# Patient Record
Sex: Male | Born: 1940 | Race: White | Hispanic: No | Marital: Married | Smoking: Never smoker
Health system: Southern US, Community
[De-identification: ages and names within clinical notes are randomized; demographics above are authoritative.]

## PROBLEM LIST (undated history)

## (undated) DIAGNOSIS — Z96659 Presence of unspecified artificial knee joint: Secondary | ICD-10-CM

## (undated) DIAGNOSIS — M4800 Spinal stenosis, site unspecified: Secondary | ICD-10-CM

## (undated) DIAGNOSIS — E041 Nontoxic single thyroid nodule: Secondary | ICD-10-CM

## (undated) DIAGNOSIS — I6529 Occlusion and stenosis of unspecified carotid artery: Secondary | ICD-10-CM

## (undated) DIAGNOSIS — C801 Malignant (primary) neoplasm, unspecified: Secondary | ICD-10-CM

## (undated) DIAGNOSIS — M1990 Unspecified osteoarthritis, unspecified site: Secondary | ICD-10-CM

## (undated) DIAGNOSIS — I10 Essential (primary) hypertension: Secondary | ICD-10-CM

## (undated) HISTORY — DX: Spinal stenosis, site unspecified: M48.00

## (undated) HISTORY — DX: Occlusion and stenosis of unspecified carotid artery: I65.29

## (undated) HISTORY — PX: EYE SURGERY: SHX253

## (undated) HISTORY — PX: JOINT REPLACEMENT: SHX530

## (undated) HISTORY — PX: CHEST WALL BIOPSY: SHX1338

## (undated) HISTORY — DX: Essential (primary) hypertension: I10

## (undated) HISTORY — DX: Unspecified osteoarthritis, unspecified site: M19.90

## (undated) HISTORY — PX: LUMBAR FUSION: SHX111

## (undated) HISTORY — DX: Presence of unspecified artificial knee joint: Z96.659

---

## 2004-11-19 ENCOUNTER — Other Ambulatory Visit: Admission: RE | Admit: 2004-11-19 | Discharge: 2004-11-19 | Payer: Self-pay | Admitting: Dermatology

## 2006-03-27 ENCOUNTER — Encounter: Admission: RE | Admit: 2006-03-27 | Discharge: 2006-03-27 | Payer: Self-pay | Admitting: Neurosurgery

## 2008-10-04 ENCOUNTER — Ambulatory Visit: Payer: Self-pay | Admitting: Orthopedic Surgery

## 2008-10-04 DIAGNOSIS — M7580 Other shoulder lesions, unspecified shoulder: Secondary | ICD-10-CM

## 2008-10-04 DIAGNOSIS — M25519 Pain in unspecified shoulder: Secondary | ICD-10-CM

## 2008-11-28 ENCOUNTER — Ambulatory Visit: Payer: Self-pay | Admitting: Orthopedic Surgery

## 2010-04-08 ENCOUNTER — Ambulatory Visit (INDEPENDENT_AMBULATORY_CARE_PROVIDER_SITE_OTHER): Payer: Medicare Other | Admitting: Internal Medicine

## 2010-04-08 DIAGNOSIS — Z7982 Long term (current) use of aspirin: Secondary | ICD-10-CM

## 2010-04-08 DIAGNOSIS — K921 Melena: Secondary | ICD-10-CM

## 2010-04-21 ENCOUNTER — Ambulatory Visit (HOSPITAL_COMMUNITY)
Admission: RE | Admit: 2010-04-21 | Discharge: 2010-04-21 | Disposition: A | Discharge status: Home / Self Care | Payer: Medicare Other | Source: Ambulatory Visit | Attending: Internal Medicine | Admitting: Internal Medicine

## 2010-04-21 ENCOUNTER — Encounter (HOSPITAL_BASED_OUTPATIENT_CLINIC_OR_DEPARTMENT_OTHER): Payer: Medicare Other | Admitting: Internal Medicine

## 2010-04-21 ENCOUNTER — Other Ambulatory Visit (INDEPENDENT_AMBULATORY_CARE_PROVIDER_SITE_OTHER): Payer: Self-pay | Admitting: Internal Medicine

## 2010-04-21 DIAGNOSIS — R195 Other fecal abnormalities: Secondary | ICD-10-CM

## 2010-04-21 DIAGNOSIS — K644 Residual hemorrhoidal skin tags: Secondary | ICD-10-CM | POA: Insufficient documentation

## 2010-04-21 DIAGNOSIS — D126 Benign neoplasm of colon, unspecified: Secondary | ICD-10-CM

## 2010-04-21 DIAGNOSIS — Z7982 Long term (current) use of aspirin: Secondary | ICD-10-CM | POA: Insufficient documentation

## 2010-04-21 DIAGNOSIS — Z79899 Other long term (current) drug therapy: Secondary | ICD-10-CM | POA: Insufficient documentation

## 2010-04-21 DIAGNOSIS — I10 Essential (primary) hypertension: Secondary | ICD-10-CM | POA: Insufficient documentation

## 2010-04-21 DIAGNOSIS — K921 Melena: Secondary | ICD-10-CM | POA: Insufficient documentation

## 2010-05-13 NOTE — Consult Note (Signed)
NAME:  Randy Pena, Randy Pena               ACCOUNT NO.:  [id number]  MEDICAL RECORD NO.:  [id number]           PATIENT TYPE:  LOCATION:                                 FACILITY:  PHYSICIAN:  Lionel December, M.D.    DATE OF BIRTH:  07-16-1940  DATE OF CONSULTATION: DATE OF DISCHARGE:                                CONSULTATION   REASON FOR CONSULTATION:  Positive fecal occult blood.  HISTORY OF PRESENT ILLNESS:  Randy Pena is a 70 year old white male referred to our office by Dr. Margo Common for a positive fecal occult blood test that was performed in February 11, 2010.  He was seen at Dr. Jackolyn Confer office for routine physical and noted to have a   positive Hemoccult.  His hemoglobin February 21, 2010, is 15 and hematocrit was 44.2.  His MCV was 97.8.  Randy Pena denies any rectal bleeding.  He has one stool a day and they are brown in color.  He denies melena.  He does occasionally see a small amount of blood when he wipes.  His appetite has remained good.  He has had no weight loss.  No abdominal pain.  No nausea, vomiting, or weight loss.  Today, he does complain of some epigastric burning which has been occurring for about 3 weeks off and on.  He states he is waking up at night, and he will drink or eat something and it will calm it down.  There are no known allergies.  Home medications include lisinopril 20 mg a day, aspirin 81 mg a day, vitamin D 1000 units a day, MVI one a day, Aleve as needed.  PAST SURGICAL HISTORY:  He had an I and D to a perirectal abscess years ago, and he had a skin cancer removed 15 years ago.  PAST MEDICAL HISTORY:  Medical history includes hypertension.  He has back pain.  He also had a colonoscopy in 2006 by Dr. Cleotis Nipper which was normal.  There were no polyps.  FAMILY HISTORY:  His mother deceased from CHF.  His father deceased from malignant lymphoma, 2 sisters deceased, one from diabetes and coronary artery disease, one died from a blood clot after  undergoing a capsule surgery, two brothers one is alive with a history of CABG in good health.  One is deceased from an MI at age 65.  SOCIAL HISTORY:  He is married.  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  He does not smoke, drink or do drugs and he has 2 children in good health.  OBJECTIVE:  His weight is 218, height 5 feet 10 inches, temperature 97.2, blood pressure 112/82, his pulse is 76.  He has natural teeth. His oral mucosa is moist.  There are no lesions.  His conjunctivae are pink.  His sclerae anicteric.  His thyroid is normal.  There is no cervical lymphadenopathy.  His lungs are clear.  His abdomen is soft. Bowel sounds are positive.  He has slight epigastric tenderness, and there is no edema to his lower extremities.  ASSESSMENT:  Randy Pena is a 70 year old male presenting today with a positive fecal occult blood  test by Dr. Margo Common.  His last colonoscopy was in 2006 which was normal.  The colonic neoplasm needs to be ruled out as well as his ulcer or arteriovenous malformation.   RECOMMENDATIONS:  We will schedule a colonoscopy with Dr. Karilyn Cota in the near future.  The risk and benefits were reviewed with the patient and he is agreeable.  I am going to also start him on omeprazole 20 mg one a day for his epigastric tenderness to see if he can get relief.    ______________________________ Dorene Ar, NP   ______________________________ Lionel December, M.D.    TS/MEDQ  D:  04/08/2010  T:  04/08/2010  Job:  [id number]  cc:   Dr. Margo Common  Electronically Signed by Dorene Ar PA on 04/21/2010 09:41:53 AM Electronically Signed by Lionel December M.D. on 05/13/2010 09:39:27 AM

## 2010-05-13 NOTE — Op Note (Signed)
  [name], [name]               ACCOUNT NO.:  [id number]  MEDICAL RECORD NO.:  [id number]           PATIENT TYPE:  O  LOCATION:  [facility]                          FACILITY:  [facility]  PHYSICIAN:  Lionel December, M.D.    DATE OF BIRTH:  [birth year]  DATE OF PROCEDURE:  04/21/2010 DATE OF DISCHARGE:                              OPERATIVE REPORT   PROCEDURE:  Colonoscopy.  INDICATION:  Randy Pena is 70 year old Caucasian male who was found to have heme-positive stool on routine exam.  He denies history of rectal bleeding, or change in his bowel habits.  His last colonoscopy was over 5 years ago.  Family history is negative for CRC.  He is on low-dose Asa and also uses OTC NSAIDs on p.r.n. basis.  Procedures were reviewed with the patient.  Informed consent was obtained.  MEDS FOR CONSCIOUS SEDATION: 1. Demerol 50 mg IV. 2. Versed 5 mg IV.  FINDINGS:  Procedure performed in endoscopy suite.  The patient's vital signs and O2 sat were monitored during the procedure and remained stable.  The patient was placed in left lateral recumbent position. Rectal examination performed.  No abnormality noted on external or digital exam.  Pentax videoscope was placed through rectum and advanced under vision into sigmoid colon and beyond.  Preparation was excellent. Scope was passed into cecum which was identified by appendiceal orifice and ileocecal valve.  Pictures were taken for the record.  As the scope was withdrawn, colonic mucosa was carefully examined.  There was 12-13 mm submucosal lesion at distal transverse colon with yellowish discoloration.  Endoscopic appearance, typical of lipoma.  Pictures taken for the record.  Mucosa and rest of the colon was normal until in the distal sigmoid where there was 3-mm polyp which was ablated via cold biopsy.  Rectal mucosa was normal.  Scope was retroflexed to examine anorectal junction and small hemorrhoids noted below the dentate line. Endoscope was withdrawn.   Withdrawal time was 12 minutes.  The patient tolerated the procedure well.  FINAL DIAGNOSES: 1. Examination performed to cecum. 2. A 3-mm polyp ablated via cold biopsy from distal sigmoid colon. 3. 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 4. External hemorrhoids.  It is possible that patient's heme-positive stool is secondary to intermittent NSAID therapy.  Since he does not have any GI symptoms and his CBC is normal, we would not pursue with further studies.  RECOMMENDATIONS:  Standard instructions given.  I will be contacting patient with results of biopsy and further recommendations.     Lionel December, M.D.     NR/MEDQ  D:  04/21/2010  T:  04/21/2010  Job:  [id number]  cc:   Wyvonnia Lora Fax: [phone]  Electronically Signed by Lionel December M.D. on 05/13/2010 09:39:47 AM

## 2010-09-16 ENCOUNTER — Ambulatory Visit (INDEPENDENT_AMBULATORY_CARE_PROVIDER_SITE_OTHER): Payer: Medicare Other | Admitting: Orthopedic Surgery

## 2010-09-16 ENCOUNTER — Encounter: Payer: Self-pay | Admitting: Orthopedic Surgery

## 2010-09-16 ENCOUNTER — Other Ambulatory Visit: Payer: Self-pay

## 2010-09-16 VITALS — Resp 16 | Ht 70.0 in | Wt 224.0 lb

## 2010-09-16 DIAGNOSIS — M1710 Unilateral primary osteoarthritis, unspecified knee: Secondary | ICD-10-CM

## 2010-09-16 DIAGNOSIS — M1712 Unilateral primary osteoarthritis, left knee: Secondary | ICD-10-CM

## 2010-09-16 NOTE — Progress Notes (Signed)
Chief complaint: RIGHT knee pain HPI:(4) This is a 70 year old male has had pain in his RIGHT knee for 10 years located over the medial side associated with aching and moderately severe at times which is intermittent and worse with activity and better with rest  ROS:(2) Denies chest pain or neurologic symptoms  PFSH: (1)  Past Medical History  Diagnosis Date  . Spinal stenosis   . DJD (degenerative joint disease)   . HTN (hypertension)     Physical Exam(12) GENERAL: normal development   CDV: pulses are normal   Skin: normal  Lymph: nodes were not palpable/normal  Psychiatric: awake, alert and oriented  Neuro: normal sensation  MSK RIGHT knee.  He ambulates without any assistive device or lap.  He has a hint of varus alignment to his knee.  He has medial joint line tenderness.  He has maintained his range of motion and strength is normal.  All ligaments are stable.  Has a negative McMurray sign.  His LEFT knee has normal strength stability and alignment and range of motion is full.  Assessment: Osteoarthritis RIGHT knee    Plan: Non-operative treatment, weight restriction activity modification exercise Tylenol vs. Anti-inflammatory for pain    X-ray report separately identifiable 3 views RIGHT knee  Patellofemoral joint shows osteoarthritic changes with spurs there is moderate to severe joint space narrowing medially varus deformity which is mild  Impression osteoarthritis of the RIGHT knee primarily medial and patellofemoral compartments  Patient is advised to keep his weight down, take Tylenol and over-the-counter anti-inflammatories for pain use ice as needed follow up if symptoms worsen.

## 2010-09-16 NOTE — Patient Instructions (Signed)
Take Tylenol arthritis daily as directed  Ice the knee when you have swelling  Call us if you need fluid drawn out or if pain gets worse

## 2010-09-17 DIAGNOSIS — M1712 Unilateral primary osteoarthritis, left knee: Secondary | ICD-10-CM | POA: Insufficient documentation

## 2011-07-10 ENCOUNTER — Telehealth: Payer: Self-pay | Admitting: Orthopedic Surgery

## 2011-07-10 NOTE — Telephone Encounter (Signed)
Per patient's request 06/17/11 for copies of Xray films, film copies received from [facility]; called patient, left message with wife, who is aware of request.  States patient is working out of town at this time; will have him pick up as soon as possible.

## 2011-07-28 ENCOUNTER — Ambulatory Visit (INDEPENDENT_AMBULATORY_CARE_PROVIDER_SITE_OTHER): Payer: Medicare Other | Admitting: Orthopedic Surgery

## 2011-07-28 ENCOUNTER — Encounter: Payer: Self-pay | Admitting: Orthopedic Surgery

## 2011-07-28 VITALS — BP 120/72 | Ht 70.0 in | Wt 222.0 lb

## 2011-07-28 DIAGNOSIS — M1712 Unilateral primary osteoarthritis, left knee: Secondary | ICD-10-CM

## 2011-07-28 DIAGNOSIS — M1710 Unilateral primary osteoarthritis, unspecified knee: Secondary | ICD-10-CM

## 2011-07-28 NOTE — Telephone Encounter (Signed)
Patient picked up film(CD) today at his scheduled appointment.

## 2011-07-28 NOTE — Progress Notes (Signed)
Subjective:     Patient ID: Randy Pena, male   DOB: 04-Jul-1940, 70 y.o.   MRN: [id number]   Chief Complaint  Patient presents with  . Follow-up    Right knee pain.    HPI H/O OA RIGHT KNEE  HE IS HAVING INCREASING PAIN AND SWELLING OVER THE MEDIAL JOINT LINE    Review of Systems RECENT MELANOMA EXCISION    Objective:   Physical Exam Physical Exam(6) GENERAL: normal development   CDV: pulses are normal   Skin: normal  Psychiatric: awake, alert and oriented  Neuro: normal sensation  MSK GAIT NORMAL  1 JOINT EFFUSION- MILD  2 ROM =120 3 MEDIAL JOINT LINE TENDERNESS  4 ALL LIGAMENTS WERE STABLE   Assessment: OA PROGRESSING   X-RAYS MEDIAL JOINT LINE TENDERNESS  Plan: INJECT  ALEVE BID X 2 WEEKS  RETURN IN 3 WEEKS      Assessment:     SEE ABOVE    Plan:     SEE ABOVE

## 2011-07-28 NOTE — Patient Instructions (Addendum)
You have received a steroid shot. 15% of patients experience increased pain at the injection site with in the next 24 hours. This is best treated with ice and tylenol extra strength 2 tabs every 8 hours. If you are still having pain please call the office.   Take 1 aleve twice a day for next 2 weeks

## 2011-08-19 ENCOUNTER — Ambulatory Visit (INDEPENDENT_AMBULATORY_CARE_PROVIDER_SITE_OTHER): Payer: Medicare Other | Admitting: Orthopedic Surgery

## 2011-08-19 ENCOUNTER — Encounter: Payer: Self-pay | Admitting: Orthopedic Surgery

## 2011-08-19 VITALS — BP 110/70 | Ht 70.0 in | Wt 222.0 lb

## 2011-08-19 DIAGNOSIS — M1710 Unilateral primary osteoarthritis, unspecified knee: Secondary | ICD-10-CM

## 2011-08-19 DIAGNOSIS — M1712 Unilateral primary osteoarthritis, left knee: Secondary | ICD-10-CM

## 2011-08-19 NOTE — Progress Notes (Signed)
Subjective:     Patient ID: Randy Pena, male   DOB: 06/03/1940, 70 y.o.   MRN: [id number] Chief Complaint  Patient presents with  . Follow-up    3 week recheck on right knee.    BP 110/70  Ht 5\' 10"  (1.778 m)  Wt 222 lb (100.699 kg)  BMI 31.85 kg/m2   HPI Status post injection of the right knee. Doing well. Some stiffness at the end of the day. Able to do all activities. Minimal pain.  Review of Systems No catching or locking    Objective:   Physical Exam Physical Exam(6) GENERAL: normal development   CDV: pulses are normal   Skin: normal  Psychiatric: awake, alert and oriented  Neuro: normal sensation  MSK ambulation is without assistive device and no limp 1 no effusion no tenderness 2 negative McMurray's 3 normal strength 4 ligament stable muscle tone normal neurovascular exam as stated intact      Assessment:     Arthritis of the knee acute flare resolved    Plan:     Activities as tolerated follow as needed

## 2011-08-19 NOTE — Patient Instructions (Signed)
activities as tolerated 

## 2012-01-20 ENCOUNTER — Ambulatory Visit (INDEPENDENT_AMBULATORY_CARE_PROVIDER_SITE_OTHER): Payer: Medicare Other

## 2012-01-20 ENCOUNTER — Ambulatory Visit (INDEPENDENT_AMBULATORY_CARE_PROVIDER_SITE_OTHER): Payer: Medicare Other | Admitting: Orthopedic Surgery

## 2012-01-20 VITALS — BP 130/70 | Ht 70.0 in | Wt 222.0 lb

## 2012-01-20 DIAGNOSIS — M25569 Pain in unspecified knee: Secondary | ICD-10-CM

## 2012-01-20 MED ORDER — TRAMADOL-ACETAMINOPHEN 37.5-325 MG PO TABS: 1.0000 | ORAL_TABLET | ORAL | Status: DC | PRN

## 2012-01-20 NOTE — Patient Instructions (Addendum)
Total Knee Replacement Total knee replacement is a procedure to replace your knee joint with an artificial knee joint (prosthetic knee joint). The purpose of this surgery is to reduce pain and improve your knee function. LET YOUR CAREGIVER KNOW ABOUT:    Any allergies you have.   Any medicines you are taking, including vitamins, herbs, eyedrops, over-the-counter medicines, and creams.   Any problems you have had with the use of anesthetics.   Family history of problems with the use of anesthetics.   Any blood disorders you have, including bleeding problems or clotting problems.   Previous surgeries you have had.  RISKS AND COMPLICATIONS   Generally, total knee replacement is a safe procedure. However, as with any surgical procedure, complications can occur. Possible complications associated with total knee replacement include:  Loss of range of motion of the knee or instability.   Loosening of the prosthesis.   Infection.   Persistent pain.  BEFORE THE PROCEDURE    Your caregiver will instruct you when you need to stop eating and drinking.   Ask your caregiver if you need to change or stop any regular medicines.  PROCEDURE   Just before the procedure you will receive medicine that will make you drowsy (sedative). This will be given through a tube that is inserted into one of your veins (intravenous [IV] tube). Then you will either receive medicine to block pain from the waist down through your legs (spinal block) or medicine to also receive medicine to make you fall asleep (general anesthetic). You may also receive medicine to block feeling in your leg (nerve block) to help ease pain after surgery. An incision will be made in your knee. Your surgeon will take out any damaged cartilage and bone by sawing off the damaged surfaces. Then the surgeon will put a new metal liner over the sawed off portion of your thigh bone (femur) and a plastic liner over the sawed off portion of one of the  bones of your lower leg (tibia). This is to restore alignment and function to your knee. A plastic piece is often used to restore the surface of your knee cap. AFTER THE PROCEDURE   You will be taken to the recovery area. You may have drainage tubes to drain excess fluid from your knee. These tubes attach to a device that removes these fluids. Once you are awake, stable, and taking fluids well, you will be taken to your hospital room. You will receive physical therapy as prescribed by your caregiver. The length of your stay in the hospital after a knee replacement is 2 4 days. Your surgeon may recommend that you spend time (usually an additional 10 14 days) in an extended-care facility to help you begin walking again and improve your range of motion before you go home. You may also be prescribed blood-thinning medicine to decrease your risk of developing blood clots in your leg. Document Released: 05/04/2000 Document Revised: 07/28/2011 Document Reviewed: 03/08/2011 [facility] Patient Information 2013 [place], [place].   Total Knee Replacement Care After Refer to this sheet in the next few weeks. These instructions provide you with information on caring for yourself after your procedure. Your caregiver also may give you specific instructions. Your treatment has been planned according to the most current medical practices, but problems sometimes occur. Call your caregiver if you have any problems or questions after your procedure. HOME CARE INSTRUCTIONS    See a physical therapist as directed by your caregiver.   Take over-the-counter or  prescription medicines for pain, discomfort, or fever only as directed by your caregiver.   Avoid lifting or driving until you are instructed otherwise.   If you have been sent home with a continuous passive motion machine, use it as directed by your caregiver.  SEEK MEDICAL CARE IF:  You have difficulty breathing.   Your wound is red, swollen, or has become  increasingly painful.   You have pus draining from your wound.   You have a bad smell coming from your wound.   You have persistent bleeding from your wound.   Your wound breaks open after sutures (stitches) or staples have been removed.  SEEK IMMEDIATE MEDICAL CARE IF:    You have a fever.   You have a rash.   You have pain or swelling in your calf or thigh.   You have shortness of breath or chest pain.   Your range of motion in your knee is decreasing rather than increasing.  MAKE SURE YOU:    Understand these instructions.   Will watch your condition.   Will get help right away if you are not doing well or get worse.  Document Released: 08/15/2004 Document Revised: 07/28/2011 Document Reviewed: 03/17/2011 [facility] Patient Information 2013 [place], [place].     Take aleve OTC

## 2012-01-21 ENCOUNTER — Encounter: Payer: Self-pay | Admitting: Orthopedic Surgery

## 2012-01-21 NOTE — Progress Notes (Signed)
Patient ID: Randy Pena, male   DOB: 07/19/1940, 71 y.o.   MRN: [id number] Chief Complaint  Patient presents with  . Follow-up    recheck right knee    Recheck right knee history of arthritis x-ray done in 2010 reviewed shows medial compartment narrowing quite significant  Patient complains of medial joint pain with swelling status post injection not too long ago.  The patient is going to [place] and to [place] next year  Review of systems otherwise normal  Physical Exam(6) GENERAL: normal development   CDV: pulses are normal   Skin: normal  Psychiatric: awake, alert and oriented  Neuro: normal sensation  MSK Gait: He is ambulating favoring the right leg limping. He has a varus thrust. 2 he has medial joint line tenderness a small joint effusion he has normal range of motion and strength normal stability is a slight flexion contracture as I negative McMurray sign   Imaging: Repeat imaging shows worsening of the medial compartment gonarthrosis  Assessment: Worsening arthritis    Plan: Aspiration injection and followup in January we will try to plan the surgery for his knee replacement around his foreign travel.  Knee  Injection Procedure Note  Pre-operative Diagnosis: right knee oa  Post-operative Diagnosis: same  Indications: pain  Anesthesia: ethyl chloride   Procedure Details   Verbal consent was obtained for the procedure. Time out was completed.The joint was prepped with alcohol, followed by  Ethyl chloride spray and A 20 gauge needle was inserted into the knee via lateral approach; 4ml 1% lidocaine and 1 ml of depomedrol  was then injected into the joint . The needle was removed and the area cleansed and dressed.  Complications:  None; patient tolerated the procedure well.

## 2012-01-28 ENCOUNTER — Ambulatory Visit: Payer: Medicare Other | Admitting: Orthopedic Surgery

## 2012-02-16 ENCOUNTER — Ambulatory Visit (INDEPENDENT_AMBULATORY_CARE_PROVIDER_SITE_OTHER): Payer: Medicare Other | Admitting: Orthopedic Surgery

## 2012-02-16 ENCOUNTER — Telehealth: Payer: Self-pay | Admitting: Orthopedic Surgery

## 2012-02-16 ENCOUNTER — Encounter: Payer: Self-pay | Admitting: Orthopedic Surgery

## 2012-02-16 VITALS — BP 124/78 | Ht 70.0 in | Wt 222.0 lb

## 2012-02-16 DIAGNOSIS — M1710 Unilateral primary osteoarthritis, unspecified knee: Secondary | ICD-10-CM

## 2012-02-16 DIAGNOSIS — M1711 Unilateral primary osteoarthritis, right knee: Secondary | ICD-10-CM

## 2012-02-16 NOTE — Progress Notes (Signed)
Patient ID: Randy Pena, male   DOB: 1941/01/16, 72 y.o.   MRN: [id number] Chief Complaint  Patient presents with  . Follow-up    Right knee follow up     This is a 72 year old male with chronic history of RIGHT knee pain.  He has suffered with his RIGHT knee. Symptoms for several years now. Over the last 6 months. He has had increasing knee pain and decreasing function. We tried oral pain medication oral anti-inflammatories, as well as injection and brace support. He tried exercises weight loss, and activity modification. He did not improve. He is scheduled to go [place] in the early spring and the like to get the knee replacement done before. He goes on a. Although we have discussed this and this may be cutting it close he would like to proceed. If he has any complications. Of course, he may not make his trip. By any problems, though we should have him ready to go by, then.  His review of systems is negative for any new symptoms.  BP 124/78  Ht 5\' 10"  (1.778 m)  Wt 222 lb (100.699 kg)  BMI 31.85 kg/m2 Physical Exam(12)  Vital signs:   GENERAL: normal development   CDV: pulses are normal   Skin: normal  Lymph: nodes were not palpable/normal  Psychiatric: awake, alert and oriented  Neuro: normal sensation  MSK  Gait: He ambulates with a Mild limp and a varus thrust. 1 Inspection Tenderness over the medial compartment with joint effusion 2 Range of Motion Flexion contracture 5 flexion arc 120 3 Motor Normal 4 Stability Normal  Other side:  5 Similar varus., Minimal medial joint line tenderness. No effusion. Full range of motion with slight flexion contracture. Normal. Motor exam no instability   Upper extremity exam  The right and left upper extremity:   Inspection and palpation revealed no abnormalities in the upper extremities.   Range of motion is full without contracture.  Motor exam is normal with grade 5 strength.  The joints are fully reduced without  subluxation.  There is no atrophy or tremor and muscle tone is normal.  All joints are stable.     Imaging Previous imaging shows severe osteoarthritis with varus deformity of the RIGHT knee  Assessment: Osteoarthritis RIGHT knee    Plan: RIGHT total knee

## 2012-02-16 NOTE — Telephone Encounter (Addendum)
Contact to insurer, [employer] at [phone] for pre-authorization information, in-patient/admit for surgery 02/22/12 at [facility], CPT [phone], ICD9 715.96, 715.16.  Spoke with Randy Pena, insurance specialist, fwd'd to direct ph# [phone].  Randy Pena; Received  Notification # [id number].  States our office will be contacted about clinicals, if need to fax.** Also-given information per [facility] that this may not be ample time in order to pre-cert surgery, and to "be prepared to have the patient re-schedule".  ** UPDATE,

## 2012-02-16 NOTE — Telephone Encounter (Signed)
(  Cont'd) 02/16/12, Update:  Surgery Date changed to:  03/07/12 (from 02/22/12) to allow for pre-authorization process and time involved.  Reached Melissa C, at [employer] - given the new surgery date. SAME Notification # [id number]. Patient aware of status.

## 2012-02-16 NOTE — Patient Instructions (Signed)
RIGHT TKA FOR JAN 13TH 2014  Scheduled for a knee replacement.   All surgeries come with some risks. The risks associated with knee replacement surgery include but are not limited to:   Bleeding,  Infection,   Stiffness,   Continued pain,   Pulmonary embolus,   Deep vein thrombosis/blood clot.  Infection is a serious complication which may require multiple surgeries and in some cases although rare require amputation or fusion of the leg.  If these risks are not worth taking in light of the pain and/or functional loss you are having please let me know so that we can continue with an alternative treatment.  In preparation for surgery   Please stop any blood thinners and any antiinflammatories: (eg. warfarin, coumadin, ticlid, aspirin, plavix, clopidrel, motrin, advil, aleve, nabumatone, relafen. Diclofenac)

## 2012-02-17 ENCOUNTER — Encounter (HOSPITAL_COMMUNITY): Payer: Self-pay | Admitting: Pharmacy Technician

## 2012-02-17 NOTE — Telephone Encounter (Addendum)
02/17/12 Received request for clinicals - faxed as requested to Attention Robyn Haber, [facility], fax [phone]. * 03/01/12 Received status of referral following review:  "Approved" for CPT code 09811 for 03/07/12 as noted, per online [facility] website notification # [id number].

## 2012-02-22 SURGERY — ARTHROPLASTY, KNEE, TOTAL
Anesthesia: Spinal | Laterality: Right

## 2012-03-02 ENCOUNTER — Encounter (HOSPITAL_COMMUNITY)
Admission: RE | Admit: 2012-03-02 | Discharge: 2012-03-02 | Disposition: A | Discharge status: Home / Self Care | Payer: Medicare Other | Source: Ambulatory Visit | Attending: Orthopedic Surgery | Admitting: Orthopedic Surgery

## 2012-03-02 ENCOUNTER — Encounter (HOSPITAL_COMMUNITY): Payer: Self-pay

## 2012-03-02 HISTORY — DX: Malignant (primary) neoplasm, unspecified: C80.1

## 2012-03-02 LAB — BASIC METABOLIC PANEL
Chloride: 103 mEq/L (ref 96–112)
Creatinine, Ser: 0.89 mg/dL (ref 0.50–1.35)
GFR calc Af Amer: >90 mL/min (ref >90)
Sodium: 137 mEq/L (ref 135–145)

## 2012-03-02 LAB — CBC
MCV: 93.3 fL (ref 78.0–100.0)
Platelets: 203 10*3/uL (ref 150–400)
RBC: 4.15 MIL/uL — ABNORMAL LOW (ref 4.22–5.81)
RDW: 13.2 % (ref 11.5–15.5)
WBC: 3.9 10*3/uL — ABNORMAL LOW (ref 4.0–10.5)

## 2012-03-02 LAB — PREPARE RBC (CROSSMATCH)

## 2012-03-02 LAB — PROTIME-INR
INR: 0.99 (ref 0.00–1.49)
Prothrombin Time: 13 seconds (ref 11.6–15.2)

## 2012-03-02 LAB — APTT: aPTT: 29 seconds (ref 24–37)

## 2012-03-02 NOTE — Patient Instructions (Addendum)
Randy Pena  03/02/2012   Your procedure is scheduled on:  03/07/2012  Report to [facility] at  900  AM.  Call this number if you have problems the morning of surgery: [phone]   Remember:   Do not eat food or drink liquids after midnight.   Take these medicines the morning of surgery with A SIP OF WATER: lisinopril   Do not wear jewelry, make-up or nail polish.  Do not wear lotions, powders, or perfumes.   Do not shave 48 hours prior to surgery. Men may shave face and neck.  Do not bring valuables to the hospital.  Contacts, dentures or bridgework may not be worn into surgery.  Leave suitcase in the car. After surgery it may be brought to your room.  For patients admitted to the hospital, checkout time is 11:00 AM the day of discharge.   Patients discharged the day of surgery will not be allowed to drive  home.  Name and phone number of your driver: family  Special Instructions: Shower using CHG 2 nights before surgery and the night before surgery.  If you shower the day of surgery use CHG.  Use special wash - you have one bottle of CHG for all showers.  You should use approximately 1/3 of the bottle for each shower.   Please read over the following fact sheets that you were given: Pain Booklet, Coughing and Deep Breathing, Blood Transfusion Information, Total Joint Packet, MRSA Information, Surgical Site Infection Prevention, Anesthesia Post-op Instructions and Care and Recovery After Surgery  Total Knee Replacement Total knee replacement is a procedure to replace your knee joint with an artificial knee joint (prosthetic knee joint). The purpose of this surgery is to reduce pain and improve your knee function. LET YOUR CAREGIVER KNOW ABOUT:   Any allergies you have.  Any medicines you are taking, including vitamins, herbs, eyedrops, over-the-counter medicines, and creams.  Any problems you have had with the use of anesthetics.  Family history of problems with the use of  anesthetics.  Any blood disorders you have, including bleeding problems or clotting problems.  Previous surgeries you have had. RISKS AND COMPLICATIONS  Generally, total knee replacement is a safe procedure. However, as with any surgical procedure, complications can occur. Possible complications associated with total knee replacement include:  Loss of range of motion of the knee or instability.  Loosening of the prosthesis.  Infection.  Persistent pain. BEFORE THE PROCEDURE   Your caregiver will instruct you when you need to stop eating and drinking.  Ask your caregiver if you need to change or stop any regular medicines. PROCEDURE  Just before the procedure you will receive medicine that will make you drowsy (sedative). This will be given through a tube that is inserted into one of your veins (intravenous [IV] tube). Then you will either receive medicine to block pain from the waist down through your legs (spinal block) or medicine to also receive medicine to make you fall asleep (general anesthetic). You may also receive medicine to block feeling in your leg (nerve block) to help ease pain after surgery. An incision will be made in your knee. Your surgeon will take out any damaged cartilage and bone by sawing off the damaged surfaces. Then the surgeon will put a new metal liner over the sawed off portion of your thigh bone (femur) and a plastic liner over the sawed off portion of one of the bones of your lower leg (tibia). This  is to restore alignment and function to your knee. A plastic piece is often used to restore the surface of your knee cap. AFTER THE PROCEDURE  You will be taken to the recovery area. You may have drainage tubes to drain excess fluid from your knee. These tubes attach to a device that removes these fluids. Once you are awake, stable, and taking fluids well, you will be taken to your hospital room. You will receive physical therapy as prescribed by your caregiver. The  length of your stay in the hospital after a knee replacement is 2 4 days. Your surgeon may recommend that you spend time (usually an additional 10 14 days) in an extended-care facility to help you begin walking again and improve your range of motion before you go home. You may also be prescribed blood-thinning medicine to decrease your risk of developing blood clots in your leg. Document Released: 05/04/2000 Document Revised: 07/28/2011 Document Reviewed: 03/08/2011 [facility] Patient Information 2013 [place], [place]. PATIENT INSTRUCTIONS POST-ANESTHESIA  IMMEDIATELY FOLLOWING SURGERY:  Do not drive or operate machinery for the first twenty four hours after surgery.  Do not make any important decisions for twenty four hours after surgery or while taking narcotic pain medications or sedatives.  If you develop intractable nausea and vomiting or a severe headache please notify your doctor immediately.  FOLLOW-UP:  Please make an appointment with your surgeon as instructed. You do not need to follow up with anesthesia unless specifically instructed to do so.  WOUND CARE INSTRUCTIONS (if applicable):  Keep a dry clean dressing on the anesthesia/puncture wound site if there is drainage.  Once the wound has quit draining you may leave it open to air.  Generally you should leave the bandage intact for twenty four hours unless there is drainage.  If the epidural site drains for more than 36-48 hours please call the anesthesia department.  QUESTIONS?:  Please feel free to call your physician or the hospital operator if you have any questions, and they will be happy to assist you.

## 2012-03-03 ENCOUNTER — Other Ambulatory Visit: Payer: Self-pay | Admitting: Orthopedic Surgery

## 2012-03-03 MED ORDER — VANCOMYCIN HCL 10 G IV SOLR: 1000.0000 mg | Freq: Once | INTRAVENOUS | Status: DC

## 2012-03-03 NOTE — H&P (Signed)
TOTAL KNEE ADMISSION H&P  Patient is being admitted for right total knee arthroplasty.  Subjective:  Chief Complaint:right knee pain.  HPI: Randy Pena, 72 y.o. male, has a history of pain and functional disability in the right knee due to arthritis and has failed non-surgical conservative treatments for greater than 12 weeks to includeNSAID's and/or analgesics, corticosteriod injections, flexibility and strengthening excercises, use of assistive devices, weight reduction as appropriate and activity modification.  Onset of symptoms was gradual, starting >10 years ago with gradually worsening course since that time. The patient noted no past surgery on the right knee(s).  Patient currently rates pain in the right knee(s) at 8 out of 10 with activity. Patient has night pain, worsening of pain with activity and weight bearing, pain that interferes with activities of daily living, pain with passive range of motion, crepitus and joint swelling.  Patient has evidence of subchondral cysts, subchondral sclerosis, periarticular osteophytes, joint subluxation and joint space narrowing by imaging studies.  There is no active infection.  Patient Active Problem List   Diagnosis Date Noted  . Osteoarthritis of right knee 02/16/2012  . Osteoarthritis of left knee 09/17/2010  . SHOULDER PAIN 10/04/2008  . IMPINGEMENT SYNDROME 10/04/2008   Past Medical History  Diagnosis Date  . Spinal stenosis   . DJD (degenerative joint disease)   . HTN (hypertension)   . 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 Chest wall biopsy     melanoma-[facility]    No prescriptions prior to admission   No Known Allergies  History  Substance Use Topics  . Smoking status: Never Smoker   . Smokeless tobacco: Not on file  . Alcohol Use: Yes     Comment: social    Family History  Problem Relation Age of Onset  . Heart disease    . Arthritis    . Cancer    . Diabetes        ROS  Objective:  Physical Exam  Vital signs in last 24 hours:    Labs:   Estimated Body mass index is 31.85 kg/(m^2) as calculated from the following:   Height as of 02/16/12: 5\' 10" (1.778 m).   Weight as of 02/16/12: 222 lb(100.699 kg).   Imaging Review Plain radiographs demonstrate moderate degenerative joint disease of the right knee(s). The overall alignment issignificant varus. The bone quality appears to be good for age and reported activity level.  Assessment/Plan:  End stage arthritis, right knee   The patient history, physical examination, clinical judgment of the provider and imaging studies are consistent with end stage degenerative joint disease of the right knee(s) and total knee arthroplasty is deemed medically necessary. The treatment options including medical management, injection therapy arthroscopy and arthroplasty were discussed at length. The risks and benefits of total knee arthroplasty were presented and reviewed. The risks due to aseptic loosening, infection, stiffness, patella tracking problems, thromboembolic complications and other imponderables were discussed. The patient acknowledged the explanation, agreed to proceed with the plan and consent was signed. Patient is being admitted for inpatient treatment for surgery, pain control, PT, OT, prophylactic antibiotics, VTE prophylaxis, progressive ambulation and ADL's and discharge planning. The patient is planning to be discharged home with home health services

## 2012-03-04 ENCOUNTER — Telehealth: Payer: Self-pay | Admitting: Orthopedic Surgery

## 2012-03-04 ENCOUNTER — Encounter: Payer: Self-pay | Admitting: Orthopedic Surgery

## 2012-03-04 NOTE — Telephone Encounter (Signed)
[facility] said the H&P for Randy Pena (Sep 05, 2040)  Is incomplete for his surgery 03/08/11

## 2012-03-04 NOTE — Progress Notes (Signed)
Patient ID: Randy Pena, male   DOB: [birth year], 72 y.o.   MRN: [id number]  Addendum to H/P  Physical Exam  Nursing note and vitals reviewed. Constitutional: He is oriented to person, place, and time. He appears well-developed and well-nourished. No distress.  HENT:  Head: Normocephalic and atraumatic.  Nose: Nose normal.  Eyes: Pupils are equal, round, and reactive to light. Right eye exhibits no discharge. Left eye exhibits no discharge.  Neck: Normal range of motion. Neck supple. No JVD present. No tracheal deviation present. No thyromegaly present.  Cardiovascular: Normal rate and intact distal pulses.   Pulmonary/Chest: Effort normal. No stridor.  Abdominal: Soft.  Musculoskeletal:       Right hip: Normal.       Left hip: Normal.       Right knee: He exhibits decreased range of motion, swelling and abnormal alignment. He exhibits no effusion, no ecchymosis, no deformity, no erythema, no LCL laxity, normal patellar mobility, no bony tenderness, normal meniscus and no MCL laxity. No lateral joint line, no MCL, no LCL and no patellar tendon tenderness noted.       Left knee: Normal.       Right ankle: Normal.       Left ankle: Normal.       Upper extremity exam  The right and left upper extremity:  Inspection and palpation revealed no abnormalities in the upper extremities.  Range of motion is full without contracture. Motor exam is normal with grade 5 strength. The joints are fully reduced without subluxation. There is no atrophy or tremor and muscle tone is normal.  All joints are stable.      Lymphadenopathy:    He has no cervical adenopathy.  Neurological: He is alert and oriented to person, place, and time. He has normal reflexes.  Skin: Skin is warm and dry. He is not diaphoretic.  Psychiatric: He has a normal mood and affect. His behavior is normal. Judgment and thought content normal.

## 2012-03-04 NOTE — H&P (Signed)
Author:  Vickki Hearing, MD  Service:  (none)  Author Type:  Physician     Filed:  03/03/12 1340  Note Time:  03/03/12 1337            TOTAL KNEE ADMISSION H&P  Patient is being admitted for right total knee arthroplasty.  Subjective:  Chief Complaint:right knee pain.  HPI: Randy Pena, 71 y.o. male, has a history of pain and functional disability in the right knee due to arthritis and has failed non-surgical conservative treatments for greater than 12 weeks to includeNSAID's and/or analgesics, corticosteriod injections, flexibility and strengthening excercises, use of assistive devices, weight reduction as appropriate and activity modification.  Onset of symptoms was gradual, starting >10 years ago with gradually worsening course since that time. The patient noted no past surgery on the right knee(s).  Patient currently rates pain in the right knee(s) at 8 out of 10 with activity. Patient has night pain, worsening of pain with activity and weight bearing, pain that interferes with activities of daily living, pain with passive range of motion, crepitus and joint swelling.  Patient has evidence of subchondral cysts, subchondral sclerosis, periarticular osteophytes, joint subluxation and joint space narrowing by imaging studies. There is no active infection.    Patient Active Problem List     Diagnosis  Date Noted   .  Osteoarthritis of right knee  02/16/2012   .  Osteoarthritis of left knee  09/17/2010   .  SHOULDER PAIN  10/04/2008   .  IMPINGEMENT SYNDROME  10/04/2008      Past Medical History   Diagnosis  Date   .  Spinal stenosis     .  DJD (degenerative joint disease)     .  HTN (hypertension)     .  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  Chest wall biopsy         [facility]anoma[facility]      No prescriptions prior to admission    No Known Allergies   History   Substance Use Topics   .  Smoking status:  Never  Smoker    .  Smokeless tobacco:  Not on file   .  Alcohol Use:  Yes         Comment: social      Family History   Problem  Relation  Age of Onset   .  Heart disease       .  Arthritis       .  Cancer       .  Diabetes           ROS no current issues outside [facility]  Objective:  Physical Exam   Addendum to H/P   Physical Exam  Nursing note and vitals reviewed. Constitutional: He is oriented to person, place, and time. He appears well-developed and well-nourished. No distress.  HENT:   Head: Normocephalic and atraumatic.   Nose: Nose normal.  Eyes: Pupils are equal, round, and reactive to light. Right eye exhibits no discharge. Left eye exhibits no discharge.  Neck: Normal range of motion. Neck supple. No JVD present. No tracheal deviation present. No thyromegaly present.  Cardiovascular: Normal rate and intact distal pulses.   Pulmonary/Chest: Effort normal. No stridor.  Abdominal: Soft.  Musculoskeletal:       Right hip: Normal.  Left hip: Normal.       Right knee: He exhibits decreased range of motion, swelling and abnormal alignment. He exhibits no effusion, no ecchymosis, no deformity, no erythema, no LCL laxity, normal patellar mobility, no bony tenderness, normal meniscus and no MCL laxity. No lateral joint line, no MCL, no LCL and no patellar tendon tenderness noted.       Left knee: Normal.       Right ankle: Normal.       Left ankle: Normal.       Upper extremity exam  The right and left upper extremity:  Inspection and palpation revealed no abnormalities in the upper extremities.  Range of motion is full without contracture. Motor exam is normal with grade 5 strength. The joints are fully reduced without subluxation. There is no atrophy or tremor and muscle tone is normal.  All joints are stable.     Lymphadenopathy:    He has no cervical adenopathy.  Neurological: He is alert and oriented to person, place, and time. He has normal reflexes.  Skin: Skin  is warm and dry. He is not diaphoretic.  Psychiatric: He has a normal mood and affect. His behavior is normal. Judgment and thought content normal.         Vital signs in last 24 hours:see nurses notes date of surgery     Labs:  CBC    Component Value Date/Time   WBC 3.9* 03/02/2012 1009   RBC 4.15* 03/02/2012 1009   HGB 13.7 03/02/2012 1009   HCT 38.7* 03/02/2012 1009   PLT 203 03/02/2012 1009   MCV 93.3 03/02/2012 1009   MCH 33.0 03/02/2012 1009   MCHC 35.4 03/02/2012 1009   RDW 13.2 03/02/2012 1009      Chemistry      Component Value Date/Time   NA 137 03/02/2012 0930   K 4.4 03/02/2012 0930   CL 103 03/02/2012 0930   CO2 27 03/02/2012 0930   BUN 13 03/02/2012 0930   CREATININE 0.89 03/02/2012 0930      Component Value Date/Time   CALCIUM 9.6 03/02/2012 0930      Estimated Body mass index is 31.85 kg/(m^2) as calculated from the following:   Height as of 02/16/12: 5\' 10" (1.778 m).   Weight as of 02/16/12: 222 lb(100.699 kg).   Imaging Review Plain radiographs demonstrate moderate degenerative joint disease of the right knee(s). The overall alignment issignificant varus. The bone quality appears to be good for age and reported activity level.  Assessment/Plan:  End stage arthritis, right knee   The patient history, physical examination, clinical judgment of the provider and imaging studies are consistent with end stage degenerative joint disease of the right knee(s) and total knee arthroplasty is deemed medically necessary. The treatment options including medical management, injection therapy arthroscopy and arthroplasty were discussed at length. The risks and benefits of total knee arthroplasty were presented and reviewed. The risks due to aseptic loosening, infection, stiffness, patella tracking problems, thromboembolic complications and other imponderables were discussed. The patient acknowledged the explanation, agreed to proceed with the plan and consent was signed. Patient is  being admitted for inpatient treatment for surgery, pain control, PT, OT, prophylactic antibiotics, VTE prophylaxis, progressive ambulation and ADL's and discharge planning. The patient is planning to be discharged home with home health services  RIGHT TKA PLANNED PROCEDURE

## 2012-03-07 ENCOUNTER — Encounter (HOSPITAL_COMMUNITY): Payer: Self-pay | Admitting: Anesthesiology

## 2012-03-07 ENCOUNTER — Encounter (HOSPITAL_COMMUNITY): Payer: Self-pay | Admitting: *Deleted

## 2012-03-07 ENCOUNTER — Inpatient Hospital Stay (HOSPITAL_COMMUNITY): Payer: Medicare Other

## 2012-03-07 ENCOUNTER — Ambulatory Visit: Payer: Medicare Other | Admitting: Orthopedic Surgery

## 2012-03-07 ENCOUNTER — Inpatient Hospital Stay (HOSPITAL_COMMUNITY)
Admission: RE | Admit: 2012-03-07 | Discharge: 2012-03-10 | DRG: 470 | Disposition: A | Discharge status: Home / Self Care | Payer: Medicare Other | Source: Ambulatory Visit | Attending: Orthopedic Surgery | Admitting: Orthopedic Surgery

## 2012-03-07 ENCOUNTER — Inpatient Hospital Stay (HOSPITAL_COMMUNITY): Payer: Medicare Other | Admitting: Anesthesiology

## 2012-03-07 ENCOUNTER — Encounter (HOSPITAL_COMMUNITY)
Admission: RE | Disposition: A | Discharge status: Home / Self Care | Payer: Self-pay | Source: Ambulatory Visit | Attending: Orthopedic Surgery

## 2012-03-07 DIAGNOSIS — IMO0002 Reserved for concepts with insufficient information to code with codable children: Principal | ICD-10-CM | POA: Diagnosis present

## 2012-03-07 DIAGNOSIS — M1990 Unspecified osteoarthritis, unspecified site: Secondary | ICD-10-CM | POA: Diagnosis present

## 2012-03-07 DIAGNOSIS — M4800 Spinal stenosis, site unspecified: Secondary | ICD-10-CM | POA: Diagnosis present

## 2012-03-07 DIAGNOSIS — I10 Essential (primary) hypertension: Secondary | ICD-10-CM | POA: Diagnosis present

## 2012-03-07 DIAGNOSIS — M1710 Unilateral primary osteoarthritis, unspecified knee: Principal | ICD-10-CM | POA: Diagnosis present

## 2012-03-07 DIAGNOSIS — Z85820 Personal history of malignant melanoma of skin: Secondary | ICD-10-CM

## 2012-03-07 DIAGNOSIS — M1711 Unilateral primary osteoarthritis, right knee: Secondary | ICD-10-CM

## 2012-03-07 HISTORY — PX: TOTAL KNEE ARTHROPLASTY: SHX125

## 2012-03-07 SURGERY — ARTHROPLASTY, KNEE, TOTAL
Anesthesia: Spinal | Site: Knee | Laterality: Right | Wound class: Clean

## 2012-03-07 MED ORDER — METHOCARBAMOL 500 MG PO TABS
500.0000 mg | ORAL_TABLET | Freq: Four times a day (QID) | ORAL | Status: DC
  Administered 2012-03-07 – 2012-03-10 (×11): 500 mg via ORAL
  Filled 2012-03-07 (×11): qty 1

## 2012-03-07 MED ORDER — MENTHOL 3 MG MT LOZG: 1.0000 | LOZENGE | OROMUCOSAL | Status: DC | PRN

## 2012-03-07 MED ORDER — MIDAZOLAM HCL 2 MG/2ML IJ SOLN
INTRAMUSCULAR | Status: AC
  Filled 2012-03-07: qty 2

## 2012-03-07 MED ORDER — BUPIVACAINE-EPINEPHRINE (PF) 0.5% -1:200000 IJ SOLN
INTRAMUSCULAR | Status: DC | PRN
  Administered 2012-03-07: 90 mL

## 2012-03-07 MED ORDER — OXYCODONE HCL 5 MG PO TABS
ORAL_TABLET | ORAL | Status: AC
  Filled 2012-03-07: qty 1

## 2012-03-07 MED ORDER — VANCOMYCIN HCL IN DEXTROSE 1-5 GM/200ML-% IV SOLN
1000.0000 mg | Freq: Three times a day (TID) | INTRAVENOUS | Status: DC
  Filled 2012-03-07 (×2): qty 200

## 2012-03-07 MED ORDER — ACETAMINOPHEN 10 MG/ML IV SOLN
1000.0000 mg | Freq: Four times a day (QID) | INTRAVENOUS | Status: AC
  Administered 2012-03-07 – 2012-03-08 (×4): 1000 mg via INTRAVENOUS
  Filled 2012-03-07 (×4): qty 100

## 2012-03-07 MED ORDER — PROPOFOL INFUSION 10 MG/ML OPTIME
INTRAVENOUS | Status: DC | PRN
  Administered 2012-03-07: 12:00:00 via INTRAVENOUS
  Administered 2012-03-07 (×2): 25 ug/kg/min via INTRAVENOUS

## 2012-03-07 MED ORDER — SENNOSIDES-DOCUSATE SODIUM 8.6-50 MG PO TABS
1.0000 | ORAL_TABLET | Freq: Every evening | ORAL | Status: DC | PRN
  Administered 2012-03-07: 1 via ORAL
  Filled 2012-03-07: qty 1

## 2012-03-07 MED ORDER — PREGABALIN 50 MG PO CAPS
ORAL_CAPSULE | ORAL | Status: AC
  Filled 2012-03-07: qty 1

## 2012-03-07 MED ORDER — DIPHENHYDRAMINE HCL 12.5 MG/5ML PO ELIX: 12.5000 mg | ORAL_SOLUTION | ORAL | Status: DC | PRN

## 2012-03-07 MED ORDER — METHOCARBAMOL 100 MG/ML IJ SOLN
500.0000 mg | Freq: Once | INTRAMUSCULAR | Status: AC
  Administered 2012-03-07: 500 mg via INTRAVENOUS
  Filled 2012-03-07: qty 5

## 2012-03-07 MED ORDER — ONDANSETRON HCL 4 MG/2ML IJ SOLN
4.0000 mg | Freq: Once | INTRAMUSCULAR | Status: AC
  Administered 2012-03-07: 4 mg via INTRAVENOUS

## 2012-03-07 MED ORDER — VANCOMYCIN HCL 10 G IV SOLR
1500.0000 mg | Freq: Three times a day (TID) | INTRAVENOUS | Status: AC
  Administered 2012-03-07 – 2012-03-08 (×2): 1500 mg via INTRAVENOUS
  Filled 2012-03-07 (×2): qty 1500

## 2012-03-07 MED ORDER — OXYCODONE HCL 5 MG PO TABS
5.0000 mg | ORAL_TABLET | ORAL | Status: DC
  Administered 2012-03-07: 5 mg via ORAL
  Filled 2012-03-07 (×2): qty 1

## 2012-03-07 MED ORDER — OXYCODONE HCL 5 MG PO TABS
5.0000 mg | ORAL_TABLET | Freq: Once | ORAL | Status: AC
  Administered 2012-03-07: 5 mg via ORAL

## 2012-03-07 MED ORDER — ONDANSETRON HCL 4 MG/2ML IJ SOLN
INTRAMUSCULAR | Status: AC
  Filled 2012-03-07: qty 2

## 2012-03-07 MED ORDER — ACETAMINOPHEN 10 MG/ML IV SOLN
INTRAVENOUS | Status: AC
  Filled 2012-03-07: qty 100

## 2012-03-07 MED ORDER — CELECOXIB 100 MG PO CAPS
ORAL_CAPSULE | ORAL | Status: AC
  Filled 2012-03-07: qty 4

## 2012-03-07 MED ORDER — CELECOXIB 100 MG PO CAPS
200.0000 mg | ORAL_CAPSULE | Freq: Two times a day (BID) | ORAL | Status: DC
  Administered 2012-03-07 – 2012-03-10 (×6): 200 mg via ORAL
  Filled 2012-03-07 (×6): qty 2

## 2012-03-07 MED ORDER — SENNA 8.6 MG PO TABS
1.0000 | ORAL_TABLET | Freq: Two times a day (BID) | ORAL | Status: DC
  Administered 2012-03-07 – 2012-03-10 (×6): 8.6 mg via ORAL
  Filled 2012-03-07 (×6): qty 1

## 2012-03-07 MED ORDER — PROPOFOL 10 MG/ML IV EMUL
INTRAVENOUS | Status: AC
  Filled 2012-03-07: qty 20

## 2012-03-07 MED ORDER — FENTANYL CITRATE 0.05 MG/ML IJ SOLN: 25.0000 ug | INTRAMUSCULAR | Status: DC | PRN

## 2012-03-07 MED ORDER — VANCOMYCIN HCL IN DEXTROSE 1-5 GM/200ML-% IV SOLN
INTRAVENOUS | Status: AC
  Filled 2012-03-07: qty 400

## 2012-03-07 MED ORDER — SODIUM CHLORIDE 0.9 % IR SOLN
Status: DC | PRN
  Administered 2012-03-07: 1000 mL

## 2012-03-07 MED ORDER — SODIUM CHLORIDE 0.9 % IV SOLN
INTRAVENOUS | Status: DC
  Administered 2012-03-07 – 2012-03-09 (×4): via INTRAVENOUS

## 2012-03-07 MED ORDER — MIDAZOLAM HCL 5 MG/5ML IJ SOLN
INTRAMUSCULAR | Status: DC | PRN
  Administered 2012-03-07: 2 mg via INTRAVENOUS

## 2012-03-07 MED ORDER — METHOCARBAMOL 100 MG/ML IJ SOLN
500.0000 mg | Freq: Four times a day (QID) | INTRAVENOUS | Status: DC
  Filled 2012-03-07 (×13): qty 5

## 2012-03-07 MED ORDER — ONDANSETRON HCL 4 MG/2ML IJ SOLN: 4.0000 mg | Freq: Once | INTRAMUSCULAR | Status: DC | PRN

## 2012-03-07 MED ORDER — CEFAZOLIN SODIUM-DEXTROSE 2-3 GM-% IV SOLR
INTRAVENOUS | Status: AC
  Filled 2012-03-07: qty 50

## 2012-03-07 MED ORDER — PREGABALIN 50 MG PO CAPS
50.0000 mg | ORAL_CAPSULE | Freq: Once | ORAL | Status: AC
  Administered 2012-03-07: 50 mg via ORAL

## 2012-03-07 MED ORDER — VANCOMYCIN HCL 10 G IV SOLR
1500.0000 mg | Freq: Once | INTRAVENOUS | Status: AC
  Administered 2012-03-07: 1500 mg via INTRAVENOUS
  Filled 2012-03-07: qty 1500

## 2012-03-07 MED ORDER — SODIUM CHLORIDE 0.9 % IR SOLN
Status: DC | PRN
  Administered 2012-03-07: 3000 mL

## 2012-03-07 MED ORDER — METOCLOPRAMIDE HCL 5 MG/ML IJ SOLN: 5.0000 mg | Freq: Three times a day (TID) | INTRAMUSCULAR | Status: DC | PRN

## 2012-03-07 MED ORDER — FENTANYL CITRATE 0.05 MG/ML IJ SOLN
INTRAMUSCULAR | Status: DC | PRN
  Administered 2012-03-07: 25 ug via INTRAVENOUS
  Administered 2012-03-07: 50 ug via INTRAVENOUS
  Administered 2012-03-07: 25 ug via INTRATHECAL

## 2012-03-07 MED ORDER — HYDROMORPHONE HCL PF 1 MG/ML IJ SOLN
0.5000 mg | INTRAMUSCULAR | Status: DC | PRN
  Administered 2012-03-07 – 2012-03-10 (×15): 0.5 mg via INTRAVENOUS
  Filled 2012-03-07 (×15): qty 1

## 2012-03-07 MED ORDER — EPHEDRINE SULFATE 50 MG/ML IJ SOLN
INTRAMUSCULAR | Status: DC | PRN
  Administered 2012-03-07 (×5): 10 mg via INTRAVENOUS

## 2012-03-07 MED ORDER — BUPIVACAINE-EPINEPHRINE PF 0.5-1:200000 % IJ SOLN
INTRAMUSCULAR | Status: AC
  Filled 2012-03-07: qty 10

## 2012-03-07 MED ORDER — METOCLOPRAMIDE HCL 10 MG PO TABS: 5.0000 mg | ORAL_TABLET | Freq: Three times a day (TID) | ORAL | Status: DC | PRN

## 2012-03-07 MED ORDER — DOCUSATE SODIUM 100 MG PO CAPS
100.0000 mg | ORAL_CAPSULE | Freq: Two times a day (BID) | ORAL | Status: DC
  Administered 2012-03-07 – 2012-03-10 (×7): 100 mg via ORAL
  Filled 2012-03-07 (×7): qty 1

## 2012-03-07 MED ORDER — ONDANSETRON HCL 4 MG/2ML IJ SOLN: 4.0000 mg | Freq: Four times a day (QID) | INTRAMUSCULAR | Status: DC | PRN

## 2012-03-07 MED ORDER — MIDAZOLAM HCL 2 MG/2ML IJ SOLN
1.0000 mg | INTRAMUSCULAR | Status: DC | PRN
  Administered 2012-03-07 (×2): 2 mg via INTRAVENOUS

## 2012-03-07 MED ORDER — LACTATED RINGERS IV SOLN
INTRAVENOUS | Status: DC
  Administered 2012-03-07 (×2): via INTRAVENOUS

## 2012-03-07 MED ORDER — OXYCODONE HCL 5 MG PO TABS
10.0000 mg | ORAL_TABLET | ORAL | Status: DC
  Administered 2012-03-07 – 2012-03-10 (×16): 10 mg via ORAL
  Filled 2012-03-07 (×13): qty 2
  Filled 2012-03-07: qty 1
  Filled 2012-03-07 (×3): qty 2

## 2012-03-07 MED ORDER — EPHEDRINE SULFATE 50 MG/ML IJ SOLN
INTRAMUSCULAR | Status: AC
  Filled 2012-03-07: qty 1

## 2012-03-07 MED ORDER — PHENOL 1.4 % MT LIQD: 1.0000 | OROMUCOSAL | Status: DC | PRN

## 2012-03-07 MED ORDER — BISACODYL 5 MG PO TBEC: 5.0000 mg | DELAYED_RELEASE_TABLET | Freq: Every day | ORAL | Status: DC | PRN

## 2012-03-07 MED ORDER — ACETAMINOPHEN 10 MG/ML IV SOLN
1000.0000 mg | Freq: Once | INTRAVENOUS | Status: AC
  Administered 2012-03-07: 1000 mg via INTRAVENOUS

## 2012-03-07 MED ORDER — BUPIVACAINE IN DEXTROSE 0.75-8.25 % IT SOLN
INTRATHECAL | Status: DC | PRN
  Administered 2012-03-07: 2 mL via INTRATHECAL

## 2012-03-07 MED ORDER — ALUM & MAG HYDROXIDE-SIMETH 200-200-20 MG/5ML PO SUSP: 30.0000 mL | ORAL | Status: DC | PRN

## 2012-03-07 MED ORDER — CELECOXIB 100 MG PO CAPS
400.0000 mg | ORAL_CAPSULE | Freq: Once | ORAL | Status: AC
  Administered 2012-03-07: 400 mg via ORAL

## 2012-03-07 MED ORDER — FLEET ENEMA 7-19 GM/118ML RE ENEM: 1.0000 | ENEMA | Freq: Once | RECTAL | Status: AC | PRN

## 2012-03-07 MED ORDER — BUPIVACAINE-EPINEPHRINE PF 0.5-1:200000 % IJ SOLN
INTRAMUSCULAR | Status: AC
  Filled 2012-03-07: qty 20

## 2012-03-07 MED ORDER — ONDANSETRON HCL 4 MG PO TABS: 4.0000 mg | ORAL_TABLET | Freq: Four times a day (QID) | ORAL | Status: DC | PRN

## 2012-03-07 MED ORDER — LISINOPRIL 10 MG PO TABS
20.0000 mg | ORAL_TABLET | Freq: Every day | ORAL | Status: DC
  Administered 2012-03-08 – 2012-03-10 (×3): 20 mg via ORAL
  Filled 2012-03-07 (×3): qty 2

## 2012-03-07 MED ORDER — CHLORHEXIDINE GLUCONATE 4 % EX LIQD: 60.0000 mL | Freq: Once | CUTANEOUS | Status: DC

## 2012-03-07 MED ORDER — ASPIRIN EC 325 MG PO TBEC
325.0000 mg | DELAYED_RELEASE_TABLET | Freq: Two times a day (BID) | ORAL | Status: DC
  Administered 2012-03-07 – 2012-03-10 (×7): 325 mg via ORAL
  Filled 2012-03-07 (×7): qty 1

## 2012-03-07 MED ORDER — FENTANYL CITRATE 0.05 MG/ML IJ SOLN
INTRAMUSCULAR | Status: AC
  Filled 2012-03-07: qty 2

## 2012-03-07 SURGICAL SUPPLY — 73 items
BAG HAMPER (MISCELLANEOUS) ×2 IMPLANT
BANDAGE ESMARK 6X9 LF (GAUZE/BANDAGES/DRESSINGS) ×1 IMPLANT
BIT DRILL 2.8X128 (BIT) ×2 IMPLANT
BIT DRILL 3.2X128 (BIT) ×1 IMPLANT
BLADE HEX COATED 2.75 (ELECTRODE) ×2 IMPLANT
BLADE SAG 18X100X1.27 (BLADE) ×2 IMPLANT
BLADE SAGITTAL 25.0X1.27X90 (BLADE) ×2 IMPLANT
BLADE SAW SAG 90X13X1.27 (BLADE) ×2 IMPLANT
BNDG CMPR 9X6 STRL LF SNTH (GAUZE/BANDAGES/DRESSINGS) ×1
BNDG ESMARK 6X9 LF (GAUZE/BANDAGES/DRESSINGS) ×2
BOWL SMART MIX CTS (DISPOSABLE) ×1 IMPLANT
CEMENT HV SMART SET (Cement) ×5 IMPLANT
CHLORAPREP W/TINT 26ML (MISCELLANEOUS) ×1 IMPLANT
CLOTH BEACON ORANGE TIMEOUT ST (SAFETY) ×2 IMPLANT
COVER LIGHT HANDLE STERIS (MISCELLANEOUS) ×4 IMPLANT
COVER PROBE W GEL 5X96 (DRAPES) ×2 IMPLANT
CUFF TOURNIQUET SINGLE 34IN LL (TOURNIQUET CUFF) ×1 IMPLANT
CUFF TOURNIQUET SINGLE 44IN (TOURNIQUET CUFF) IMPLANT
DECANTER SPIKE VIAL GLASS SM (MISCELLANEOUS) ×6 IMPLANT
DRAPE BACK TABLE (DRAPES) ×2 IMPLANT
DRAPE EXTREMITY T 121X128X90 (DRAPE) ×2 IMPLANT
DRAPE U-SHAPE 47X51 STRL (DRAPES) ×1 IMPLANT
DRSG MEPILEX BORDER 4X12 (GAUZE/BANDAGES/DRESSINGS) ×2 IMPLANT
DURAPREP 26ML APPLICATOR (WOUND CARE) ×1 IMPLANT
ELECT REM PT RETURN 9FT ADLT (ELECTROSURGICAL) ×2
ELECTRODE REM PT RTRN 9FT ADLT (ELECTROSURGICAL) ×1 IMPLANT
EVACUATOR 3/16  PVC DRAIN (DRAIN) ×1
EVACUATOR 3/16 PVC DRAIN (DRAIN) ×1 IMPLANT
FACESHIELD LNG OPTICON STERILE (SAFETY) ×2 IMPLANT
GLOVE BIO SURGEON STRL SZ7 (GLOVE) ×1 IMPLANT
GLOVE ECLIPSE 7.0 STRL STRAW (GLOVE) ×3 IMPLANT
GLOVE EXAM NITRILE MD LF STRL (GLOVE) ×1 IMPLANT
GLOVE INDICATOR 7.0 STRL GRN (GLOVE) ×3 IMPLANT
GLOVE INDICATOR 7.5 STRL GRN (GLOVE) ×2 IMPLANT
GLOVE OPTIFIT SS 8.0 STRL (GLOVE) ×2 IMPLANT
GLOVE SKINSENSE NS SZ8.0 LF (GLOVE) ×3
GLOVE SKINSENSE STRL SZ8.0 LF (GLOVE) ×2 IMPLANT
GLOVE SS BIOGEL STRL SZ 6.5 (GLOVE) IMPLANT
GLOVE SS N UNI LF 8.5 STRL (GLOVE) ×2 IMPLANT
GLOVE SUPERSENSE BIOGEL SZ 6.5 (GLOVE) ×2
GOWN STRL REIN XL XLG (GOWN DISPOSABLE) ×8 IMPLANT
HANDPIECE INTERPULSE COAX TIP (DISPOSABLE) ×2
HOOD W/PEELAWAY (MISCELLANEOUS) ×8 IMPLANT
INST SET MAJOR BONE (KITS) ×2 IMPLANT
IV NS IRRIG 3000ML ARTHROMATIC (IV SOLUTION) ×2 IMPLANT
KIT BLADEGUARD II DBL (SET/KITS/TRAYS/PACK) ×2 IMPLANT
KIT ROOM TURNOVER APOR (KITS) ×2 IMPLANT
MANIFOLD NEPTUNE II (INSTRUMENTS) ×2 IMPLANT
MARKER SKIN DUAL TIP RULER LAB (MISCELLANEOUS) ×2 IMPLANT
NDL HYPO 21X1.5 SAFETY (NEEDLE) ×2 IMPLANT
NEEDLE HYPO 21X1.5 SAFETY (NEEDLE) ×2 IMPLANT
NS IRRIG 1000ML POUR BTL (IV SOLUTION) ×2 IMPLANT
PACK TOTAL JOINT (CUSTOM PROCEDURE TRAY) ×2 IMPLANT
PAD ARMBOARD 7.5X6 YLW CONV (MISCELLANEOUS) ×2 IMPLANT
PAD DANNIFLEX CPM (ORTHOPEDIC SUPPLIES) ×2 IMPLANT
PIN TROCAR 3 INCH (PIN) ×2 IMPLANT
SET BASIN LINEN APH (SET/KITS/TRAYS/PACK) ×2 IMPLANT
SET HNDPC FAN SPRY TIP SCT (DISPOSABLE) ×1 IMPLANT
SPONGE DRAIN TRACH 4X4 STRL 2S (GAUZE/BANDAGES/DRESSINGS) ×1 IMPLANT
SPONGE GAUZE 4X4 12PLY (GAUZE/BANDAGES/DRESSINGS) IMPLANT
STAPLER VISISTAT 35W (STAPLE) ×2 IMPLANT
SUT BRALON NAB BRD #1 30IN (SUTURE) ×6 IMPLANT
SUT MON AB 0 CT1 (SUTURE) ×1 IMPLANT
SUT MON AB 2-0 CT1 36 (SUTURE) ×1 IMPLANT
SYR 20CC LL (SYRINGE) ×1 IMPLANT
SYR 30ML LL (SYRINGE) ×3 IMPLANT
SYR BULB IRRIGATION 50ML (SYRINGE) ×2 IMPLANT
TAPE CLOTH SURG 4X10 WHT LF (GAUZE/BANDAGES/DRESSINGS) ×1 IMPLANT
TOWEL OR 17X26 4PK STRL BLUE (TOWEL DISPOSABLE) ×2 IMPLANT
TOWER CARTRIDGE SMART MIX (DISPOSABLE) ×3 IMPLANT
TRAY FOLEY CATH 14FR (SET/KITS/TRAYS/PACK) ×2 IMPLANT
WATER STERILE IRR 1000ML POUR (IV SOLUTION) ×8 IMPLANT
YANKAUER SUCT 12FT TUBE ARGYLE (SUCTIONS) ×2 IMPLANT

## 2012-03-07 NOTE — Anesthesia Preprocedure Evaluation (Addendum)
Anesthesia Evaluation  Patient identified by MRN, date of birth, ID band Patient awake    Reviewed: Allergy & Precautions, H&P , NPO status , Patient's Chart, lab work & pertinent test results  Airway Mallampati: II TM Distance: >3 FB Neck ROM: Full    Dental  (+) Partial Lower and Partial Upper   Pulmonary neg pulmonary ROS,    Pulmonary exam normal       Cardiovascular hypertension, Pt. on medications Rhythm:Regular Rate:Normal     Neuro/Psych negative neurological ROS  negative psych ROS   GI/Hepatic negative GI ROS, Neg liver ROS,   Endo/Other  negative endocrine ROS  Renal/GU negative Renal ROS     Musculoskeletal   Abdominal Normal abdominal exam  (+)   Peds  Hematology negative hematology ROS (+)   Anesthesia Other Findings   Reproductive/Obstetrics                          Anesthesia Physical Anesthesia Plan  ASA: II  Anesthesia Plan: Spinal   Post-op Pain Management:    Induction: Intravenous  Airway Management Planned: Mask  Additional Equipment:   Intra-op Plan:   Post-operative Plan:   Informed Consent: I have reviewed the patients History and Physical, chart, labs and discussed the procedure including the risks, benefits and alternatives for the proposed anesthesia with the patient or authorized representative who has indicated his/her understanding and acceptance.     Plan Discussed with: CRNA  Anesthesia Plan Comments:         Anesthesia Quick Evaluation

## 2012-03-07 NOTE — Transfer of Care (Signed)
Immediate Anesthesia Transfer of Care Note  Patient: Randy Pena  Procedure(s) Performed: Procedure(s) (LRB) with comments: TOTAL KNEE ARTHROPLASTY (Right) - depuy  Patient Location: PACU  Anesthesia Type:Spinal  Level of Consciousness: awake, alert , oriented and patient cooperative  Airway & Oxygen Therapy: Patient Spontanous Breathing and Patient connected to face mask oxygen  Post-op Assessment: Report given to PACU RN and Post -op Vital signs reviewed and stable  Post vital signs: Reviewed and stable  Complications: No apparent anesthesia complications

## 2012-03-07 NOTE — Brief Op Note (Signed)
03/07/2012  1:37 PM  PATIENT:  Deveron Furlong  72 y.o. male  PRE-OPERATIVE DIAGNOSIS:  osteoarthritis right  knee  POST-OPERATIVE DIAGNOSIS:  osteoarthritis right  knee  PROCEDURE:  Procedure(s) (LRB) with comments: TOTAL KNEE ARTHROPLASTY (Right) - depuy  SURGEON:  Surgeon(s) and Role:    * Vickki Hearing, MD - Primary  PHYSICIAN ASSISTANT:   ASSISTANTS: betty ashley and Kat    ANESTHESIA:   spinal  EBL:  Total I/O In: 1500 [I.V.:1500] Out: 30 [Urine:30]  BLOOD ADMINISTERED:none  DRAINS: 1 joint hemovac  LOCAL MEDICATIONS USED:  MARCAINE 0.5% w epi    and Amount: 90 ml  SPECIMEN:  No Specimen  DISPOSITION OF SPECIMEN:    COUNTS:  YES  TOURNIQUET:   Total Tourniquet Time Documented: Thigh (Right) - 108 minutes  DICTATION: .Reubin Milan Dictation  PLAN OF CARE: Admit to inpatient   PATIENT DISPOSITION:  PACU - hemodynamically stable.   Delay start of Pharmacological VTE agent (>24hrs) due to surgical blood loss or risk of bleeding: yes

## 2012-03-07 NOTE — Anesthesia Procedure Notes (Signed)
Spinal  Patient location during procedure: OR Start time: 03/07/2012 11:11 AM Staffing CRNA/Resident: Glynn Octave E Preanesthetic Checklist Completed: patient identified, site marked, surgical consent, pre-op evaluation, timeout performed, IV checked, risks and benefits discussed and monitors and equipment checked Spinal Block Patient position: right lateral decubitus Prep: Betadine Patient monitoring: heart rate, cardiac monitor, continuous pulse ox and blood pressure Approach: right paramedian Location: L3-4 Injection technique: single-shot Needle Needle type: Spinocan  Needle gauge: 22 G Needle length: 9 cm Assessment Sensory level: T8 Additional Notes ATTEMPTS:1 TRAY QI:[id number] TRAY EXPIRATION DATE:01/2013

## 2012-03-07 NOTE — Op Note (Addendum)
Preop diagnosis osteoarthritis right  knee Postop diagnosis same Procedure right  total knee arthroplasty Surgeon Romeo Apple Assisted by betty Morrie Sheldon and kat  Anesthesia spinal Findings severe medial OA  Mild varus  Tourniquet time 107 minutes, pressure 300 mm of mercury  depuy 38f 4t 38 p 10 poly   Indications for procedure disabling knee pain, failure to control pain with nonoperative measures  Details of procedure:  In the preop area the patient's righ knee was marked and countersigned by the surgeon, the chart was updated, consent was signed  The patient was taken to the operating room for spinal anesthetic followed by administering 1.5 g of Vancomycin due to colonization with mrsa and weight > 100kg A Foley catheter was inserted sterilely, then the operative extremity  was prepped and draped sterilely  The timeout was completed  The operative limb was then exsanguinated with a six-inch Esmarch with the knee in flexion and the tourniquet was elevated to 300 mm of mercury. A midline incision was made, the subcutaneous tissues were divided down to the extensor mechanism. A medial arthrotomy was performed, the patella was everted,  the fat pad was resected. The medial and  lateral menisci were resected. The medial soft tissue sleeve was elevated to the mid coronal plane. The anterior cruciate ligament and PCL were resected. Osteophytes were removed. The distal femur anterior surface was skeletonized with sharp dissection.  A three-eighths inch drill bit was used to enter the femoral canal which was suctioned and irrigated until the fluid was clear;  an intramedullary rod was placed in the femur with a 5 , right 10 mm distal  setting, the block was pinned in place; then an 10mm distal femoral resection was performed. The cut was checked for flatness.  The sizing guide was then placed on the femur, the femur measured a size 5; the block was pinned in external rotation 3 using the epicondyles  as reference; a  4-in-1 cutting block was placed and the 4 cuts were made with retractors protecting the collateral ligaments. The posterior osteophytes were removed with a curved osteotome. Residual PCL tissue was resected; residual meniscal tissue was resected.  The external tibial alignment instrument was set for tibial resection. The guide was   placed referencing the medial side which was the worn side and the stylus was set at 2 mm resection. Anterior slope was built in to match the patients anatomy; a neutral varus valgus cut was set using the medial third of the tibial tubercle as reference along with the medial portion of the lateral tibial spine. Theguide was stabilized with pins.  A saw was used to resect the anterior tibia. The tibia was sized to a size 4 base plate.  We then placed spacer blocks using a  10 and lamina spreaders to assess gap balance;  the knee was balanced  in extension and was balanced in flexion with the 10 spacer block   The box cut was then done using the box cutting guide. We then turned our attention to the patella.  The patella measured 26 mm we set the guide to leave 14 mm of patella.  After resection of the patella,  It was remeasured, and measured 16 mm. The size was 38. We then drilled the 3 peg holes.  We then did a trial reduction. The trial reduction was excellent with full extension, balanced in extension, balance in flexion. Passive flexion equalled 120, patella normal tracking.   We then punched the tibia per technique.  The bone was then irrigated and dried while cement was mixed on the back table. The implants were checked for accuracy and then cemented in place; excess cement was removed; the cement was allowed to cure. The wound was then irrigated with copious amounts of saline, the posterior capsule was injected with 30 cc of Marcaine with epinephrine followed by 30 cc in the soft tissue. The 10 insert was placed. The cement set up surprisingly fast,  and reduction was difficult, requiring removal of the poly and further medial release to open the medial flexion space. (the cement expanded and the medial flexion space decreased). Reduction was then performed. Range of motion matched trial reduction  The capsule was closed with #1 Bralon in interrupted and running fashion and then the joint was injected with 30 cc of Marcaine with epinephrine.  The subcutaneous tissues were closed with  0 Monocryl in running fashion  Staples were used to reapproximate the skin edges.  Sterile dressings were applied. A radiograph will be obtained in PACU  The patient was then taken to the recovery room in stable condition.  Routine postop plan for knee replacement.

## 2012-03-07 NOTE — Plan of Care (Signed)
Problem: Consults Goal: Diagnosis- Total Joint Replacement Outcome: Completed/Met Date Met:  03/07/12 Primary Total Knee

## 2012-03-07 NOTE — Interval H&P Note (Signed)
History and Physical Interval Note:  03/07/2012 10:41 AM  Randy Pena  has presented today for surgery, with the diagnosis of osteoarthritis right  knee  The various methods of treatment have been discussed with the patient and family. After consideration of risks, benefits and other options for treatment, the patient has consented to  Procedure(s) (LRB) with comments: TOTAL KNEE ARTHROPLASTY (Right) - depuy as a surgical intervention .  The patient's history has been reviewed, patient examined, no change in status, stable for surgery.  I have reviewed the patient's chart and labs.  Questions were answered to the patient's satisfaction.     Fuller Canada

## 2012-03-07 NOTE — Anesthesia Postprocedure Evaluation (Signed)
  Anesthesia Post-op Note  Patient: Randy Pena  Procedure(s) Performed: Procedure(s) (LRB) with comments: TOTAL KNEE ARTHROPLASTY (Right) - depuy  Patient Location: PACU  Anesthesia Type:Spinal  Level of Consciousness: awake, alert , oriented and patient cooperative  Airway and Oxygen Therapy: Patient Spontanous Breathing  Post-op Pain: 4 /10, moderate  Post-op Assessment: Post-op Vital signs reviewed, Patient's Cardiovascular Status Stable, Respiratory Function Stable, Patent Airway, No signs of Nausea or vomiting and Pain level controlled  Post-op Vital Signs: Reviewed and stable  Complications: No apparent anesthesia complications

## 2012-03-08 ENCOUNTER — Encounter (HOSPITAL_COMMUNITY): Payer: Self-pay | Admitting: Orthopedic Surgery

## 2012-03-08 LAB — CBC
HCT: 34.7 % — ABNORMAL LOW (ref 39.0–52.0)
Hemoglobin: 12.3 g/dL — ABNORMAL LOW (ref 13.0–17.0)
MCHC: 35.4 g/dL (ref 30.0–36.0)
RBC: 3.66 MIL/uL — ABNORMAL LOW (ref 4.22–5.81)
WBC: 5.4 10*3/uL (ref 4.0–10.5)

## 2012-03-08 LAB — BASIC METABOLIC PANEL
BUN: 13 mg/dL (ref 6–23)
CO2: 26 mEq/L (ref 19–32)
Chloride: 104 mEq/L (ref 96–112)
GFR calc non Af Amer: 81 mL/min — ABNORMAL LOW (ref >90)
Glucose, Bld: 100 mg/dL — ABNORMAL HIGH (ref 70–99)
Potassium: 4.4 mEq/L (ref 3.5–5.1)
Sodium: 137 mEq/L (ref 135–145)

## 2012-03-08 NOTE — Progress Notes (Signed)
Subjective: 1 Day Post-Op Procedure(s) (LRB): TOTAL KNEE ARTHROPLASTY (Right) Patient reports pain as moderate.    Objective: Vital signs in last 24 hours: Temp:  [97.2 F (36.2 C)-98.4 F (36.9 C)] 98 F (36.7 C) (01/28 0601) Pulse Rate:  [74-88] 77  (01/28 0522) Resp:  [11-20] 20  (01/28 0522) BP: (90-153)/(47-119) 118/72 mmHg (01/28 0522) SpO2:  [92 %-100 %] 98 % (01/28 0522)  Intake/Output from previous day: 01/27 0701 - 01/28 0700 In: 4019.2 [P.O.:340; I.V.:3429.2; IV Piggyback:250] Out: 3870 [Urine:3680; Drains:190] Intake/Output this shift:     Basename 03/08/12 0441  HGB 12.3*    Basename 03/08/12 0441  WBC 5.4  RBC 3.66*  HCT 34.7*  PLT 167    Basename 03/08/12 0441  NA 137  K 4.4  CL 104  CO2 26  BUN 13  CREATININE 0.97  GLUCOSE 100*  CALCIUM 8.4   No results found for this basename: LABPT:2,INR:2 in the last 72 hours  Neurologically intact Neurovascular intact Sensation intact distally Intact pulses distally Dorsiflexion/Plantar flexion intact Compartment soft  Assessment/Plan: 1 Day Post-Op Procedure(s) (LRB): TOTAL KNEE ARTHROPLASTY (Right) Advance diet Up with therapy  Fuller Canada 03/08/2012, 7:42 AM

## 2012-03-08 NOTE — Care Management Note (Signed)
    Page 1 of 2   03/10/2012     10:24:29 AM   CARE MANAGEMENT NOTE 03/10/2012  Patient:  Randy Pena, Randy Pena   Account Number:  [id number]  Date Initiated:  03/08/2012  Documentation initiated by:  Sharrie Rothman  Subjective/Objective Assessment:   Pt admitted from home s/p right total knee. Pt lives with his wife and will return home at discharge. Pt has a rolling walker and BSC.     Action/Plan:   Pt did chose [facility] for PT and RN and CPM. Alroy Bailiff of [facility] is aware and will collect the pts information from the chart. Will arrange CPM prior to discharge.   Anticipated DC Date:  03/10/2012   Anticipated DC Plan:  HOME W HOME HEALTH SERVICES      DC Planning Services  CM consult      PAC Choice  DURABLE MEDICAL EQUIPMENT  HOME HEALTH   Choice offered to / List presented to:  C-1 Patient   DME arranged  CPM      DME agency  [facility]     HH arranged  HH-3 OT  HH-2 PT      [facility] agency  [facility]   Status of service:  Completed, signed off Medicare Important Message given?  YES (If response is "NO", the following Medicare IM given date fields will be blank) Date Medicare IM given:  03/10/2012 Date Additional Medicare IM given:    Discharge Disposition:  HOME W HOME HEALTH SERVICES  Per UR Regulation:    If discussed at Long Length of Stay Meetings, dates discussed:    Comments:  03/10/12 1020 Arlyss Queen, RN BSN CM Pt discharged home today with [facility] PT and OT. Abby Johsonson is aware and will collect the pts information from the chart. Pt CPM will be delivered by [facility] to pts home after discharge. Pt and pts nurse is aware of discharge arrangements.  03/08/12 1430 Arlyss Queen, RN BSN CM

## 2012-03-08 NOTE — Progress Notes (Signed)
UR Chart Review Completed  

## 2012-03-08 NOTE — Addendum Note (Signed)
Addendum  created 03/08/12 1042 by Franco Nones, CRNA   Modules edited:Notes Section

## 2012-03-08 NOTE — Anesthesia Postprocedure Evaluation (Signed)
Anesthesia Post Note  Patient: Randy Pena  Procedure(s) Performed: Procedure(s) (LRB): TOTAL KNEE ARTHROPLASTY (Right)  Anesthesia type: Spinal  Patient location: 305 Post pain: Pain level moderate  Post assessment: Post-op Vital signs reviewed, Patient's Cardiovascular Status Stable, Respiratory Function Stable, Patent Airway, No signs of Nausea or vomiting and Pain level controlled  Post vital signs: Reviewed and stable  Level of consciousness: awake and alert   Complications: No apparent anesthesia complications

## 2012-03-08 NOTE — Anesthesia Postprocedure Evaluation (Signed)
  Anesthesia Post-op Note  Patient: Randy Pena  Procedure(s) Performed: Procedure(s) (LRB) with comments: TOTAL KNEE ARTHROPLASTY (Right) - depuy  Patient Location: room 305  Anesthesia Type:Spinal  Level of Consciousness: awake, alert , oriented and patient cooperative  Airway and Oxygen Therapy: Patient Spontanous Breathing  Post-op Pain: 6 /10, moderate  Post-op Assessment: Post-op Vital signs reviewed, Patient's Cardiovascular Status Stable, Respiratory Function Stable, Patent Airway, No signs of Nausea or vomiting and Adequate PO intake  Post-op Vital Signs: Reviewed and stable  Complications: No apparent anesthesia complications

## 2012-03-08 NOTE — Evaluation (Signed)
Occupational Therapy Evaluation Patient Details Name: Randy Pena MRN: [id number] DOB: [birth year] Today's Date: 03/08/2012 Time: 0920-1000 OT Time Calculation (min): 40 min  OT Assessment / Plan / Recommendation Clinical Impression  Patient is a 72 y/o male s/p R TKR presenting to acute OT with all education complete. Patient will benefit from home health OT to assess safety in home environment and  ADL performance.     OT Assessment  All further OT needs can be met in the next venue of care    Follow Up Recommendations  Home health OT       Equipment Recommendations  None recommended by OT          Precautions / Restrictions Precautions Precautions: Fall Precaution Booklet Issued: No Precaution Comments: pt already has booklet Restrictions Weight Bearing Restrictions: Yes RLE Weight Bearing: Weight bearing as tolerated   Pertinent Vitals/Pain Pain noted in right knee. Medicated PTA.    ADL  Lower Body Dressing: Simulated;Maximal assistance Where Assessed - Lower Body Dressing: Supine, head of bed up Toilet Transfer: Performed;Minimal assistance Toilet Transfer Method: Stand pivot Toilet Transfer Equipment:  (to recliner) Transfers/Ambulation Related to ADLs: patient transfers at [facility] Assist level with walker.      Visit Information  Last OT Received On: 03/08/12 Assistance Needed: +1 PT/OT Co-Evaluation/Treatment: Yes    Subjective Data  Subjective: "I have pain in my knee but I just received something from the nurse for it." Patient Stated Goal: To go home.   Prior Functioning     Home Living Lives With: Spouse Available Help at Discharge: Family;Available 24 hours/day Type of Home: House Home Access: Stairs to enter [employer] of Steps: 5 Entrance Stairs-Rails: Left Home Layout: One level Bathroom Shower/Tub: Walk-in shower;Door [employer] Toilet: Standard Bathroom Accessibility: Yes How Accessible: Accessible via walker Home Adaptive  Equipment: Hand-held shower hose;Bedside commode/3-in-1;Walker - rolling;Shower chair with back Prior Function Level of Independence: Independent Able to Take Stairs?: Yes Driving: Yes Vocation: Retired Musician: No difficulties Dominant Hand: Right            Cognition  Overall Cognitive Status: Appears within functional limits for tasks assessed/performed Arousal/Alertness: Awake/alert Orientation Level: Appears intact for tasks assessed Behavior During Session: [facility] for tasks performed    Extremity/Trunk Assessment Right Upper Extremity Assessment RUE ROM/Strength/Tone: Within functional levels (MMT: 5/5) RUE Sensation: WFL - Light Touch;WFL - Proprioception RUE Coordination: WFL - gross/fine motor Left Upper Extremity Assessment LUE ROM/Strength/Tone: Within functional levels (MMT: 5/5) LUE Sensation: WFL - Light Touch;WFL - Proprioception LUE Coordination: WFL - gross/fine motor    Mobility Bed Mobility Bed Mobility: Supine to Sit;Sit to Supine Supine to Sit: 4: Min assist;HOB elevated;With rails Sit to Supine: Not Tested (comment) Details for Bed Mobility Assistance: assist needed to move RLE OOB Transfers Sit to Stand: 4: Min guard;With upper extremity assist;From bed Stand to Sit: 4: Min guard;With upper extremity assist;To chair/3-in-1              End of Session OT - End of Session Activity Tolerance: Patient tolerated treatment well Patient left: in chair;with call bell/phone within reach;with family/visitor present (with PT)    Randy Pena, OTR/L 03/08/2012, 11:23 AM

## 2012-03-08 NOTE — Clinical Social Work Note (Signed)
CSW received referral for possible SNF. Pt evaluated by PT today and recommendation is for home health. CM aware and to follow for home needs. CSW signing off but can be reconsulted if needed.   Derenda Fennel, [place] [phone]

## 2012-03-08 NOTE — Addendum Note (Signed)
Addendum  created 03/08/12 1250 by Benen J Jashan Cotten, CRNA   Modules edited:Notes Section    

## 2012-03-08 NOTE — Progress Notes (Signed)
Physical Therapy Treatment Patient Details Name: Randy Pena MRN: [id number] DOB: December 19, 1940 Today's Date: 03/08/2012 Time: 4098-1191 PT Time Calculation (min): 46 min  PT Assessment / Plan / Recommendation Comments on Treatment Session  Pt is very tired from sitting but able to ambulate 25' with walker.  His knee was placed in CPM but he could not tolerate flexion to 60 deg.  It was programmed to be a 50 deg and I have asked RN to increase to 60 deg after about an hour.    Follow Up Recommendations  Home health PT     Does the patient have the potential to tolerate intense rehabilitation     Barriers to Discharge None      Equipment Recommendations  None recommended by PT    Recommendations for Other Services OT consult  Frequency 7X/week   Plan Discharge plan remains appropriate;Frequency remains appropriate    Precautions / Restrictions Precautions Precautions: Fall Precaution Booklet Issued: No Precaution Comments: pt already has booklet Restrictions Weight Bearing Restrictions: Yes RLE Weight Bearing: Partial weight bearing (per PT consult, right total knee replacement)   Pertinent Vitals/Pain     Mobility  Bed Mobility Bed Mobility: Sit to Supine Supine to Sit: 4: Min assist;HOB elevated;With rails Sit to Supine: 4: Min assist Details for Bed Mobility Assistance: assist needed to move RLE OOB Transfers Transfers: Sit to Stand;Stand to Sit Sit to Stand: 4: Min guard;With upper extremity assist;From chair/3-in-1 Stand to Sit: 4: Min guard;With upper extremity assist;To bed Ambulation/Gait Ambulation/Gait Assistance: 4: Min assist Ambulation Distance (Feet): 25 Feet Assistive device: Rolling walker Gait Pattern: Decreased step length - right;Decreased stance time - left General Gait Details: pt is stable with walker, but am unable to eval gait due to short distance walked Stairs: No Wheelchair Mobility Wheelchair Mobility: No    Exercises Total Joint  Exercises Ankle Circles/Pumps: AROM;Both;10 reps;Supine Quad Sets: AROM;Both;10 reps;Supine Short Arc Quad: AAROM;Both;10 reps;Supine Heel Slides: AAROM;Right;10 reps;Supine Knee Flexion: AAROM;Right;5 reps;Seated Goniometric ROM: 12-68 deg   PT Diagnosis: Difficulty walking;Abnormality of gait;Acute pain  PT Problem List: Decreased strength;Decreased range of motion;Decreased activity tolerance;Decreased mobility;Decreased knowledge of use of DME;Decreased safety awareness;Pain PT Treatment Interventions: DME instruction;Gait training;Therapeutic activities;Therapeutic exercise;Patient/family education;Stair training   PT Goals Acute Rehab PT Goals PT Goal Formulation: With patient/family Time For Goal Achievement: 03/15/12 Potential to Achieve Goals: Good Pt will go Supine/Side to Sit: with supervision;with HOB 0 degrees PT Goal: Supine/Side to Sit - Progress: Goal set today Pt will go Sit to Supine/Side: with supervision;with HOB 0 degrees PT Goal: Sit to Supine/Side - Progress: Progressing toward goal Pt will go Sit to Stand: with modified independence;with upper extremity assist PT Goal: Sit to Stand - Progress: Progressing toward goal Pt will Transfer Bed to Chair/Chair to Bed: with supervision PT Transfer Goal: Bed to Chair/Chair to Bed - Progress: Progressing toward goal Pt will Ambulate: 16 - 50 feet;with supervision;with rolling walker PT Goal: Ambulate - Progress: Progressing toward goal Pt will Go Up / Down Stairs: 3-5 stairs;with rail(s);with min assist;with rolling walker PT Goal: Up/Down Stairs - Progress: Goal set today  Visit Information  Last PT Received On: 03/08/12 PT/OT Co-Evaluation/Treatment: Yes    Subjective Data  Subjective: I'm tired Patient Stated Goal: return home   Cognition       Balance     End of Session PT - End of Session Equipment Utilized During Treatment: Gait belt Activity Tolerance: Patient tolerated treatment well Patient left: in  bed;in CPM;with family/visitor  present Nurse Communication: Mobility status CPM Right Knee CPM Right Knee: On Right Knee Flexion (Degrees): 50  Right Knee Extension (Degrees): 0    GP     Myrlene Broker L 03/08/2012, 1:52 PM

## 2012-03-08 NOTE — Telephone Encounter (Signed)
No response, patient had the surgery 03/07/12

## 2012-03-08 NOTE — Evaluation (Signed)
Physical Therapy Evaluation Patient Details Name: Randy Pena MRN: [id number] DOB: 1941-01-02 Today's Date: 03/08/2012 Time: 4098-1191 PT Time Calculation (min): 53 min  PT Assessment / Plan / Recommendation Clinical Impression  Pt was seen for initial eval/tx.  He is alert and very cooperative.  Pain is well controlled and he tolerated ther ex well.  ROM R knee is 12-68 deg with -8 deg extension during stretching.  Min assist needed to transfer OOB and he was able to take a few steps to the chair.  He was intructed in TKR protocol and should progress well .  He should be able to d/c to home.    PT Assessment  Patient needs continued PT services    Follow Up Recommendations  Home health PT    Does the patient have the potential to tolerate intense rehabilitation      Barriers to Discharge None      Equipment Recommendations  None recommended by PT    Recommendations for Other Services OT consult   Frequency 7X/week    Precautions / Restrictions Precautions Precautions: Fall Precaution Booklet Issued: No Precaution Comments: pt already has booklet Restrictions Weight Bearing Restrictions: Yes RLE Weight Bearing: Weight bearing as tolerated   Pertinent Vitals/Pain       Mobility  Bed Mobility Bed Mobility: Supine to Sit;Sit to Supine Supine to Sit: 4: Min assist;HOB elevated;With rails Sit to Supine: Not Tested (comment) Details for Bed Mobility Assistance: assist needed to move RLE OOB Transfers Transfers: Sit to Stand;Stand to Sit Sit to Stand: 4: Min guard;With upper extremity assist;From bed Stand to Sit: 4: Min guard;With upper extremity assist;To chair/3-in-1 Ambulation/Gait Ambulation/Gait Assistance: 4: Min guard Ambulation Distance (Feet): 4 Feet Assistive device: Rolling walker General Gait Details: pt is stable with walker, but am unable to eval gait due to short distance walked Stairs: No Wheelchair Mobility Wheelchair Mobility: No    Shoulder  Instructions     Exercises Total Joint Exercises Ankle Circles/Pumps: AROM;Both;10 reps;Supine Quad Sets: AROM;Both;10 reps;Supine Short Arc Quad: AAROM;Right;10 reps;Supine Heel Slides: AAROM;Right;10 reps;Supine Knee Flexion: AAROM;Right;5 reps;Seated Goniometric ROM: 12-68 deg   PT Diagnosis: Difficulty walking;Abnormality of gait;Acute pain  PT Problem List: Decreased strength;Decreased range of motion;Decreased activity tolerance;Decreased mobility;Decreased knowledge of use of DME;Decreased safety awareness;Pain PT Treatment Interventions: DME instruction;Gait training;Therapeutic activities;Therapeutic exercise;Patient/family education;Stair training   PT Goals Acute Rehab PT Goals PT Goal Formulation: With patient/family Time For Goal Achievement: 03/15/12 Potential to Achieve Goals: Good Pt will go Supine/Side to Sit: with supervision;with HOB 0 degrees PT Goal: Supine/Side to Sit - Progress: Goal set today Pt will go Sit to Supine/Side: with supervision;with HOB 0 degrees PT Goal: Sit to Supine/Side - Progress: Goal set today Pt will go Sit to Stand: with modified independence;with upper extremity assist PT Goal: Sit to Stand - Progress: Goal set today Pt will Transfer Bed to Chair/Chair to Bed: with supervision PT Transfer Goal: Bed to Chair/Chair to Bed - Progress: Goal set today Pt will Ambulate: 16 - 50 feet;with supervision;with rolling walker PT Goal: Ambulate - Progress: Goal set today Pt will Go Up / Down Stairs: 3-5 stairs;with rail(s);with min assist;with rolling walker PT Goal: Up/Down Stairs - Progress: Goal set today  Visit Information  Last PT Received On: 03/08/12 Assistance Needed: +1 PT/OT Co-Evaluation/Treatment: Yes    Subjective Data  Subjective: no c/o Patient Stated Goal: return home   Prior Functioning  Home Living Lives With: Spouse Available Help at Discharge: Family;Available 24 hours/day Type of  Home: House Home Access: Stairs to  enter [employer] of Steps: 5 Entrance Stairs-Rails: Left Home Layout: One level Bathroom Shower/Tub: Walk-in shower;Door [employer] Toilet: Standard Bathroom Accessibility: Yes How Accessible: Accessible via walker Home Adaptive Equipment: Hand-held shower hose;Bedside commode/3-in-1;Walker - rolling;Shower chair with back Prior Function Level of Independence: Independent Able to Take Stairs?: Yes Driving: Yes Vocation: Retired Musician: No difficulties Dominant Hand: Right    Cognition  Overall Cognitive Status: Appears within functional limits for tasks assessed/performed Arousal/Alertness: Awake/alert Orientation Level: Appears intact for tasks assessed Behavior During Session: [facility] for tasks performed    Extremity/Trunk Assessment Right Upper Extremity Assessment RUE ROM/Strength/Tone: Within functional levels (MMT: 5/5) RUE Sensation: WFL - Light Touch;WFL - Proprioception RUE Coordination: WFL - gross/fine motor Left Upper Extremity Assessment LUE ROM/Strength/Tone: Within functional levels (MMT: 5/5) LUE Sensation: WFL - Light Touch;WFL - Proprioception LUE Coordination: WFL - gross/fine motor Right Lower Extremity Assessment RLE ROM/Strength/Tone: Deficits RLE ROM/Strength/Tone Deficits: knee ROM is 12-68 deg, able to get -8 deg extension with stretch RLE Sensation: WFL - Light Touch RLE Coordination: WFL - gross motor Left Lower Extremity Assessment LLE ROM/Strength/Tone: Within functional levels LLE Sensation: WFL - Light Touch LLE Coordination: WFL - gross motor Trunk Assessment Trunk Assessment: Normal   Balance    End of Session PT - End of Session Equipment Utilized During Treatment: Gait belt Activity Tolerance: Patient tolerated treatment well Patient left: in chair;with call bell/phone within reach;with family/visitor present Nurse Communication: Mobility status  GP     Konrad Penta 03/08/2012, 10:22 AM

## 2012-03-09 LAB — CBC
Platelets: 171 10*3/uL (ref 150–400)
RDW: 13.2 % (ref 11.5–15.5)
WBC: 6.3 10*3/uL (ref 4.0–10.5)

## 2012-03-09 NOTE — Progress Notes (Signed)
Subjective: 2 Days Post-Op Procedure(s) (LRB): TOTAL KNEE ARTHROPLASTY (Right) Patient reports pain as moderate.    Objective: Vital signs in last 24 hours: Temp:  [97.9 F (36.6 C)-98.2 F (36.8 C)] 98.2 F (36.8 C) (01/29 0634) Pulse Rate:  [77-89] 77  (01/29 0634) Resp:  [18-20] 20  (01/29 0800) BP: (107-129)/(63-93) 107/93 mmHg (01/29 0634) SpO2:  [94 %-97 %] 95 % (01/29 0800)  Intake/Output from previous day: 01/28 0701 - 01/29 0700 In: 3990.4 [P.O.:1080; I.V.:2910.4] Out: 4800 [Urine:4650; Drains:150] Intake/Output this shift: Total I/O In: 520 [P.O.:520] Out: 400 [Urine:400]   Basename 03/09/12 0536 03/08/12 0441  HGB 12.5* 12.3*    Basename 03/09/12 0536 03/08/12 0441  WBC 6.3 5.4  RBC 3.75* 3.66*  HCT 35.5* 34.7*  PLT 171 167    Basename 03/08/12 0441  NA 137  K 4.4  CL 104  CO2 26  BUN 13  CREATININE 0.97  GLUCOSE 100*  CALCIUM 8.4   No results found for this basename: LABPT:2,INR:2 in the last 72 hours  Neurologically intact Neurovascular intact Sensation intact distally Intact pulses distally Dorsiflexion/Plantar flexion intact Incision: scant drainage Compartment soft  Assessment/Plan: 2 Days Post-Op Procedure(s) (LRB): TOTAL KNEE ARTHROPLASTY (Right) Up with therapy  Randy Pena 03/09/2012, 9:51 AM

## 2012-03-09 NOTE — Progress Notes (Signed)
Physical Therapy Treatment Patient Details Name: Randy Pena MRN: [id number] DOB: October 10, 1940 Today's Date: 03/09/2012 Time: 0925-1000 PT Time Calculation (min): 35 min  PT Assessment / Plan / Recommendation Comments on Treatment Session   Pt tolerated well    Follow Up Recommendations  HH   Does the patient have the potential to tolerate intense rehabilitation   n/a  Barriers to Discharge  none      Equipment Recommendations    RW   Recommendations for Other Services  none  Frequency   7x  Plan  continue acute PT     Precautions / Restrictions Precautions Precautions: None   Pertinent Vitals/Pain 6/10 w/ exercise    Mobility  Bed Mobility Supine to Sit: 4: Min guard Sit to Supine: 7: Independent Transfers Sit to Stand: 5: Supervision Stand to Sit: 5: Supervision Ambulation/Gait Ambulation/Gait Assistance: 5: Supervision Ambulation Distance (Feet): 35 Feet Assistive device: Rolling walker Gait Pattern: Decreased step length - right;Right foot flat Gait velocity: slow    Exercises Total Joint Exercises Ankle Circles/Pumps: AROM;10 reps Quad Sets: AROM;10 reps Short Arc Quad: AAROM;10 reps;Right Heel Slides: AAROM;Right;10 reps Hip ABduction/ADduction: AROM;10 reps;Supine Straight Leg Raises: AROM;5 reps;Right Goniometric ROM: 10-73   PT Diagnosis:   difficulty walkind PT Problem List:  decreased strength/ROM PT Treatment Interventions:  GT/EX      Visit Information  Last PT Received On: 03/09/12    Subjective Data  Subjective: I'm feeling better Patient Stated Goal: going home   Cognition  Overall Cognitive Status: Appears within functional limits for tasks assessed/performed Arousal/Alertness: Awake/alert Orientation Level: Appears intact for tasks assessed Behavior During Session: [facility] for tasks performed       End of Session  Pt left in chair with call light   GP     RUSSELL,CINDY 03/09/2012, 10:06 AM

## 2012-03-09 NOTE — Progress Notes (Signed)
Physical Therapy Treatment Patient Details Name: Randy Pena MRN: [id number] DOB: 08/20/1940 Today's Date: 03/09/2012 Time: 1315-1400 PT Time Calculation (min): 45 min  PT Assessment / Plan / Recommendation Comments on Treatment Session  Pt progressed very well today.  Anticipate D/C tomorrow.  Pt will need stair training prior to discharge when IV is taken out.     Follow Up Recommendations  Home health PT     Does the patient have the potential to tolerate intense rehabilitation   n/a  Barriers to Discharge  none      Equipment Recommendations  None recommended by PT    Recommendations for Other Services  [facility]  Frequency     Plan Discharge plan remains appropriate    Precautions / Restrictions Precautions Precautions: None Restrictions Weight Bearing Restrictions: No   Pertinent Vitals/Pain 6/10 w/ CPM    Mobility  Bed Mobility Sit to Supine: 6: Modified independent (Device/Increase time) Transfers Transfers: Sit to Stand Sit to Stand: 6: Modified independent (Device/Increase time) Stand to Sit: 7: Independent Ambulation/Gait Ambulation/Gait Assistance: 6: Modified independent (Device/Increase time) Ambulation Distance (Feet): 75 Feet Assistive device: Rolling walker Gait Pattern: Decreased step length - right;Right foot flat Stairs: No    Exercises Total Joint Exercises Ankle Circles/Pumps: AROM;Both;10 reps Quad Sets: AROM;Both;10 reps Towel Squeeze: AROM;Right;5 reps Short Arc Quad: AROM;Right;5 reps Heel Slides: AROM;Right;5 reps Hip ABduction/ADduction: AROM;Right;15 reps Straight Leg Raises: AROM;Right;5 reps Goniometric ROM: 8-72   PT Diagnosis:   decreased strength, stiffness, difficulty walking PT Problem List:  same PT Treatment Interventions:   ex, gt  PT Goals Acute Rehab PT Goals PT Goal Formulation: With patient PT Goal: Supine/Side to Sit - Progress: Progressing toward goal PT Goal: Sit to Supine/Side - Progress: Met PT Goal: Sit to  Stand - Progress: Met PT Transfer Goal: Bed to Chair/Chair to Bed - Progress: Met PT Goal: Ambulate - Progress: Met PT Goal: Up/Down Stairs - Progress: Not met  Visit Information  Last PT Received On: 03/09/12    Subjective Data  Subjective: I feel more comfortable walking Patient Stated Goal: To go home   Cognition  Overall Cognitive Status: Appears within functional limits for tasks assessed/performed Arousal/Alertness: Awake/alert Orientation Level: Appears intact for tasks assessed Behavior During Session: [facility] for tasks performed    Balance     End of Session PT - End of Session Equipment Utilized During Treatment: Gait belt Activity Tolerance: Patient tolerated treatment well Patient left: in bed;with call bell/phone within reach;with family/visitor present CPM Right Knee CPM Right Knee: On Right Knee Flexion (Degrees): 65  Right Knee Extension (Degrees): 0    GP     RUSSELL,CINDY 03/09/2012, 2:06 PM

## 2012-03-10 LAB — CBC
Hemoglobin: 12.4 g/dL — ABNORMAL LOW (ref 13.0–17.0)
MCHC: 35.2 g/dL (ref 30.0–36.0)
RBC: 3.73 MIL/uL — ABNORMAL LOW (ref 4.22–5.81)

## 2012-03-10 MED ORDER — OXYCODONE-ACETAMINOPHEN 5-325 MG PO TABS: 1.0000 | ORAL_TABLET | ORAL | Status: DC | PRN

## 2012-03-10 MED ORDER — BISACODYL 5 MG PO TBEC: 5.0000 mg | DELAYED_RELEASE_TABLET | Freq: Every day | ORAL | Status: DC | PRN

## 2012-03-10 MED ORDER — METHOCARBAMOL 500 MG PO TABS: 500.0000 mg | ORAL_TABLET | Freq: Four times a day (QID) | ORAL | Status: DC

## 2012-03-10 MED ORDER — ASPIRIN 325 MG PO TBEC: 325.0000 mg | DELAYED_RELEASE_TABLET | Freq: Two times a day (BID) | ORAL | Status: DC

## 2012-03-10 NOTE — Discharge Summary (Signed)
  Discharge summary  Admission date March 07, 2012  Discharge date March 10, 2012  Admission diagnosis osteoarthritis right knee  Discharge diagnosis same  Procedure right total knee arthroplasty  Implant DEPUY right total knee.  Hospital course: The patient was admitted on January 27 FOR uncomplicated right total knee arthroplasty. He tolerated this well. He was able to ambulate 50 feet his knee flexion was approximately 65. He did well with physical therapy and was stable for discharge on the 30th  Hemoglobin & Hematocrit     Component Value Date/Time   HGB 12.4* 03/10/2012 0513   HCT 35.2* 03/10/2012 0513     Medication List     As of 03/10/2012  8:50 AM    STOP taking these medications         aspirin 81 MG tablet      TAKE these medications         aspirin 325 MG EC tablet   Take 1 tablet (325 mg total) by mouth 2 (two) times daily.      bisacodyl 5 MG EC tablet   Commonly known as: DULCOLAX   Take 1 tablet (5 mg total) by mouth daily as needed.      Fish Oil 1200 MG Caps   Take 1,200 mg by mouth daily.      lisinopril 20 MG tablet   Commonly known as: PRINIVIL,ZESTRIL   Take 20 mg by mouth daily.      methocarbamol 500 MG tablet   Commonly known as: ROBAXIN   Take 1 tablet (500 mg total) by mouth 4 (four) times daily.      MULTIVITAMIN PO   Take 1 tablet by mouth daily.      oxyCODONE-acetaminophen 5-325 MG per tablet   Commonly known as: PERCOCET/ROXICET   Take 1 tablet by mouth every 4 (four) hours as needed for pain.      traMADol-acetaminophen 37.5-325 MG per tablet   Commonly known as: ULTRACET   Take 1 tablet by mouth every 4 (four) hours as needed for pain.      Vitamin D 1000 UNITS capsule   Take 1,000 Units by mouth daily.         Weightbearing as tolerated, CPM for 3 weeks. Ecotrin twice a day with TED hose for 6 weeks for DVT prophylaxis.   Followup visit will be February 6. Checks incision remove staples if appropriate

## 2012-03-10 NOTE — Progress Notes (Signed)
Physical Therapy Treatment Patient Details Name: Randy Pena MRN: [id number] DOB: 1940/09/12 Today's Date: 03/10/2012 Time: 4098-1191 PT Time Calculation (min): 52 min  PT Assessment / Plan / Recommendation Comments on Treatment Session  Pt progressing well and tolerated total treatment well though limited by fatigue at end of session.  Instructed stair training backwards with walker and forwards with no AD just one handrail.  Pt able to demonstrate safe mechanics and feels confident with stairs for return home later today.    Follow Up Recommendations        Does the patient have the potential to tolerate intense rehabilitation     Barriers to Discharge        Equipment Recommendations       Recommendations for Other Services    Frequency     Plan      Precautions / Restrictions Precautions Precautions: None Restrictions Weight Bearing Restrictions: No RLE Weight Bearing: Weight bearing as tolerated   Pertinent Vitals/Pain     Mobility  Bed Mobility Bed Mobility: Supine to Sit Supine to Sit: 5: Supervision (min assist with R LE) Details for Bed Mobility Assistance: assist needed to move RLE OOB Transfers Transfers: Sit to Stand;Stand to Sit Sit to Stand: 7: Independent;With upper extremity assist Stand to Sit: 7: Independent;With upper extremity assist Details for Transfer Assistance: pt able to verbalize and demonstrate safe mechanics with STS with UE A and feeling back of legs before sitting Ambulation/Gait Ambulation/Gait Assistance: 6: Modified independent (Device/Increase time) Ambulation Distance (Feet): 45 Feet (3 sets; bed to door, stairwell and return to room to chair) Assistive device: Rolling walker Gait Pattern: Decreased stance time - right;Decreased step length - left Gait velocity: slow but stable General Gait Details: pt stable with walker Stairs: Yes Stairs Assistance: 4: Min assist Stair Management Technique: One rail  Left;Backwards;Forwards;With walker Number of Stairs: 4  Wheelchair Mobility Wheelchair Mobility: No    Exercises Total Joint Exercises Ankle Circles/Pumps: AROM;Both;15 reps;Supine Quad Sets: AROM;Both;10 reps;Supine Short Arc Quad: AAROM;Strengthening;Right;10 reps Heel Slides: AAROM;Strengthening;Right;10 reps Knee Flexion: AAROM;Right;5 reps;Seated Goniometric ROM: 8-72   PT Diagnosis:    PT Problem List:   PT Treatment Interventions:     PT Goals Acute Rehab PT Goals PT Goal Formulation: With patient/family Time For Goal Achievement: 03/15/12 Potential to Achieve Goals: Good Pt will go Supine/Side to Sit: with supervision;with HOB 0 degrees PT Goal: Supine/Side to Sit - Progress: Progressing toward goal (R LE A OOB) Pt will go Sit to Supine/Side: with supervision;with HOB 0 degrees Pt will go Sit to Stand: with modified independence;with upper extremity assist PT Goal: Sit to Stand - Progress: Met Pt will Transfer Bed to Chair/Chair to Bed: with supervision PT Transfer Goal: Bed to Chair/Chair to Bed - Progress: Met Pt will Ambulate: 16 - 50 feet;with supervision;with rolling walker PT Goal: Ambulate - Progress: Met Pt will Go Up / Down Stairs: 3-5 stairs;with rail(s);with min assist;with rolling walker PT Goal: Up/Down Stairs - Progress: Met  Visit Information  Last PT Received On: 03/10/12    Subjective Data  Subjective: I am ready to go home, pain 3/10 at rest but does increased to 7/10 with activity.  Pt pre-medicated prior therapy.   Cognition  Overall Cognitive Status: Appears within functional limits for tasks assessed/performed Arousal/Alertness: Awake/alert Orientation Level: Appears intact for tasks assessed Behavior During Session: [facility] for tasks performed    Balance     End of Session PT - End of Session Equipment Utilized During Treatment:  Gait belt Activity Tolerance: Patient tolerated treatment well;Patient limited by fatigue Patient left: in  chair;with call bell/phone within reach Nurse Communication: Mobility status   GP     Juel Burrow 03/10/2012, 9:57 AM

## 2012-03-10 NOTE — Progress Notes (Signed)
Patient received discharge instructions along with follow up appointments and prescriptions. Patient verbalized understanding of all instructions. Home health was set up for patient. Patient was escorted via wheelchair to vehicle. Patient discharged to home in stable condition.

## 2012-03-11 ENCOUNTER — Telehealth: Payer: Self-pay | Admitting: Orthopedic Surgery

## 2012-03-11 ENCOUNTER — Other Ambulatory Visit: Payer: Self-pay | Admitting: Orthopedic Surgery

## 2012-03-11 DIAGNOSIS — R110 Nausea: Secondary | ICD-10-CM

## 2012-03-11 MED ORDER — POLYETHYLENE GLYCOL 3350 17 G PO PACK: 17.0000 g | PACK | Freq: Every day | ORAL | Status: DC

## 2012-03-11 MED ORDER — PROMETHAZINE HCL 25 MG PO TABS: 25.0000 mg | ORAL_TABLET | Freq: Four times a day (QID) | ORAL | Status: DC | PRN

## 2012-03-11 MED ORDER — MAGNESIUM CITRATE PO SOLN: 296.0000 mL | Freq: Once | ORAL | Status: DC

## 2012-03-11 NOTE — Telephone Encounter (Signed)
Patient's wife called to relay that patient has been nauseous since coming home from hospital yesterday, 03/10/12; asking if Dr. Romeo Apple can order something for relief?  States he is not allergic to any medications.  Pharmacy is [facility] in [place].  Patient's phone # is [phone]

## 2012-03-11 NOTE — Telephone Encounter (Signed)
Forwarding to Dr. Romeo Apple

## 2012-03-14 LAB — TYPE AND SCREEN
ABO/RH(D): O POS
Antibody Screen: NEGATIVE
Unit division: 0

## 2012-03-17 ENCOUNTER — Ambulatory Visit (INDEPENDENT_AMBULATORY_CARE_PROVIDER_SITE_OTHER): Payer: Medicare Other | Admitting: Orthopedic Surgery

## 2012-03-17 ENCOUNTER — Encounter: Payer: Self-pay | Admitting: Orthopedic Surgery

## 2012-03-17 ENCOUNTER — Other Ambulatory Visit: Payer: Self-pay | Admitting: *Deleted

## 2012-03-17 VITALS — BP 140/88 | Ht 70.0 in | Wt 222.0 lb

## 2012-03-17 DIAGNOSIS — Z96659 Presence of unspecified artificial knee joint: Secondary | ICD-10-CM

## 2012-03-17 MED ORDER — OXYCODONE-ACETAMINOPHEN 5-325 MG PO TABS: 1.0000 | ORAL_TABLET | ORAL | Status: DC | PRN

## 2012-03-17 NOTE — Telephone Encounter (Signed)
Done

## 2012-03-17 NOTE — Patient Instructions (Addendum)
Continue therapy

## 2012-03-17 NOTE — Progress Notes (Signed)
Patient ID: TAREQ DWAN, male   DOB: [birth year], 72 y.o.   MRN: [id number] Chief Complaint  Patient presents with  . Follow-up    Post op # 1 TKA right knee DOS 03/07/12    Postop visit status post total knee arthroplasty Date of surgery  03-07-2012 Diagnosis  Osteoarthritis Operative findings SEVERE OA  Implant Depuy  posterior stabilized total knee replacement DVT prophylaxis Ecotrin twice a day with TED hose for 4 weeks  Living situation HOME  Complaints NONE  Exam CLEAN INCISION, STAPLES TAKEN OUT AND STERISTRIPS APPLIED  Plan CONTINUE THERAPY  REFILL PERCOCET 4 WK F/U  [PT NOTE ROM 10-75] LOOKS 5-75 HERE

## 2012-03-28 ENCOUNTER — Telehealth: Payer: Self-pay | Admitting: Orthopedic Surgery

## 2012-03-28 ENCOUNTER — Other Ambulatory Visit: Payer: Self-pay | Admitting: *Deleted

## 2012-03-28 DIAGNOSIS — Z96659 Presence of unspecified artificial knee joint: Secondary | ICD-10-CM

## 2012-03-28 DIAGNOSIS — E70329 Oculocutaneous albinism, unspecified: Secondary | ICD-10-CM

## 2012-03-28 NOTE — Telephone Encounter (Signed)
Faxed order for PT to [facility] in [place]

## 2012-03-28 NOTE — Telephone Encounter (Signed)
Per call from Kathlene November, physical therapist, [facility], his ph# [phone]. Please enter order for out-patient therapy, to [facility] in [place] - fax # [phone], ph # [phone].  Patient selected this location.

## 2012-04-06 ENCOUNTER — Telehealth: Payer: Self-pay | Admitting: Orthopedic Surgery

## 2012-04-06 NOTE — Telephone Encounter (Signed)
Patient called to ask if there is any type of over the counter or prescription "cream or lotion" that he may use around incision, where skin is dry?  He states his knee is "just fine" from his total knee surgery of 02/22/12.  His next appointment is 04/14/12.  Patient pharmacy is [facility]; patient ph# is [phone]

## 2012-04-06 NOTE — Telephone Encounter (Signed)
Called patient back and informed him it is ok to use lotion around incision but not to get it in any open area.

## 2012-04-14 ENCOUNTER — Ambulatory Visit (INDEPENDENT_AMBULATORY_CARE_PROVIDER_SITE_OTHER): Payer: Medicare Other | Admitting: Orthopedic Surgery

## 2012-04-14 VITALS — BP 130/82 | Ht 70.0 in | Wt 222.0 lb

## 2012-04-14 DIAGNOSIS — Z96659 Presence of unspecified artificial knee joint: Secondary | ICD-10-CM

## 2012-04-14 HISTORY — DX: Presence of unspecified artificial knee joint: Z96.659

## 2012-04-14 MED ORDER — HYDROCODONE-ACETAMINOPHEN 7.5-325 MG PO TABS: 1.0000 | ORAL_TABLET | ORAL | Status: DC | PRN

## 2012-04-14 NOTE — Progress Notes (Signed)
Patient ID: Randy Pena, male   DOB: [birth year], 72 y.o.   MRN: [id number] Chief Complaint  Patient presents with  . Follow-up    post op 2 Right TKA DOS 03/07/12    BP 130/82  Ht 5\' 10"  (1.778 m)  Wt 222 lb (100.699 kg)  BMI 31.85 kg/m2  The patient is 6 weeks post right total knee doing well 90 of flexion as above 5 flexion contracture. He had a flexion contracture preop. He's working on it with gravity assisted prone extensions and gravity assisted flexion  I showed him a couple of other exercises to do such as the chair rock.  Return in 3 weeks he is about to go to [place] so like to see him before he goes away. He'll start aspirin a week before history of take 1 twice a day and get up frequently while on the airplane.

## 2012-04-14 NOTE — Patient Instructions (Addendum)
1 week before start 1 ecotrin twice a day

## 2012-05-05 ENCOUNTER — Ambulatory Visit (INDEPENDENT_AMBULATORY_CARE_PROVIDER_SITE_OTHER): Payer: Medicare Other | Admitting: Orthopedic Surgery

## 2012-05-05 ENCOUNTER — Encounter: Payer: Self-pay | Admitting: Orthopedic Surgery

## 2012-05-05 VITALS — BP 112/80 | Ht 70.0 in | Wt 222.0 lb

## 2012-05-05 DIAGNOSIS — Z96659 Presence of unspecified artificial knee joint: Secondary | ICD-10-CM

## 2012-05-05 DIAGNOSIS — Z96651 Presence of right artificial knee joint: Secondary | ICD-10-CM

## 2012-05-05 MED ORDER — HYDROCODONE-ACETAMINOPHEN 7.5-325 MG PO TABS: 1.0000 | ORAL_TABLET | ORAL | Status: DC | PRN

## 2012-05-05 NOTE — Progress Notes (Signed)
Patient ID: Randy Pena, male   DOB: 09-Oct-1940, 72 y.o.   MRN: [id number] Chief Complaint  Patient presents with  . Follow-up    Post op 3 right TKA DOS 03/07/12    BP 112/80  Ht 5\' 10"  (1.778 m)  Wt 222 lb (100.699 kg)  BMI 31.85 kg/m2  S/P total knee replacement, right - Plan: HYDROcodone-acetaminophen (NORCO) 7.5-325 MG per tablet, Ambulatory referral to Physical Therapy   The patient sign is doing well his knee flexion is up to 95 he has no significant pain he will take aspirin starting a week before strip and continue it through his trip until he gets back calcium about a month from now.  Overall doing well status post right knee replacement

## 2012-06-07 ENCOUNTER — Ambulatory Visit (INDEPENDENT_AMBULATORY_CARE_PROVIDER_SITE_OTHER): Payer: Medicare Other | Admitting: Orthopedic Surgery

## 2012-06-07 ENCOUNTER — Encounter: Payer: Self-pay | Admitting: Orthopedic Surgery

## 2012-06-07 VITALS — BP 122/68 | Ht 70.0 in | Wt 222.0 lb

## 2012-06-07 DIAGNOSIS — Z96651 Presence of right artificial knee joint: Secondary | ICD-10-CM

## 2012-06-07 DIAGNOSIS — Z96659 Presence of unspecified artificial knee joint: Secondary | ICD-10-CM

## 2012-06-07 NOTE — Progress Notes (Signed)
Patient ID: Randy Pena, male   DOB: [birth year], 72 y.o.   MRN: [id number] Chief Complaint  Patient presents with  . Follow-up    Follow up Right TKA DOS 03/07/12    HISTORY: 3 month followup visit for right total knee replacement the patient has come back from [place] he tolerated his time they're well he is not complaining of any pain just soreness yesterday in therapy his range of motion was 5-111  His goal is 120  Is advised to prone hangs continue exercises followup in 3 months

## 2012-06-07 NOTE — Patient Instructions (Signed)
Continue exercises as tolerated

## 2012-09-06 ENCOUNTER — Encounter: Payer: Self-pay | Admitting: Orthopedic Surgery

## 2012-09-06 ENCOUNTER — Ambulatory Visit (INDEPENDENT_AMBULATORY_CARE_PROVIDER_SITE_OTHER): Payer: Medicare Other | Admitting: Orthopedic Surgery

## 2012-09-06 VITALS — BP 127/81 | Ht 70.0 in | Wt 220.0 lb

## 2012-09-06 DIAGNOSIS — Z96651 Presence of right artificial knee joint: Secondary | ICD-10-CM

## 2012-09-06 DIAGNOSIS — Z96659 Presence of unspecified artificial knee joint: Secondary | ICD-10-CM

## 2012-09-06 NOTE — Progress Notes (Signed)
Patient ID: Randy Pena, male   DOB: 1940/08/26, 72 y.o.   MRN: [id number] Chief Complaint  Patient presents with  . Follow-up    3 month recheck on right TKA. DOS 03-07-12.   BP 127/81  Ht 5\' 10"  (1.778 m)  Wt 220 lb (99.791 kg)  BMI 31.57 kg/m2 Encounter Diagnosis  Name Primary?  . S/P total knee replacement, right Yes    He is back to all his normal activities including yard work and locking. He has some swelling if he's been on his feet for 8 that goes away having no pain  Musculoskeletal system review denies instability complains of some stiffness in left knee medial pain  Vital signs as recorded he is ambulating without assistive devices he has no tenderness or swelling in his knee his range of motion is 105 knee stable in extension and 90 of flexion extension power is normal his incision healed nicely.  Post total knee doing well followup in 6 months for the one-year x-ray

## 2012-09-06 NOTE — Patient Instructions (Addendum)
Return sometime in January for 1 yr TKA right knee follow up

## 2013-03-09 ENCOUNTER — Ambulatory Visit (INDEPENDENT_AMBULATORY_CARE_PROVIDER_SITE_OTHER): Payer: Medicare Other | Admitting: Orthopedic Surgery

## 2013-03-09 ENCOUNTER — Encounter: Payer: Self-pay | Admitting: Orthopedic Surgery

## 2013-03-09 ENCOUNTER — Ambulatory Visit (INDEPENDENT_AMBULATORY_CARE_PROVIDER_SITE_OTHER): Payer: Medicare Other

## 2013-03-09 VITALS — BP 148/78 | Ht 70.0 in | Wt 232.0 lb

## 2013-03-09 DIAGNOSIS — Z96659 Presence of unspecified artificial knee joint: Secondary | ICD-10-CM | POA: Insufficient documentation

## 2013-03-09 NOTE — Progress Notes (Signed)
Subjective:     Patient ID: Randy Pena, male   DOB: [birth year], 73 y.o.   MRN: [id number]  Chief Complaint  Patient presents with  . Follow-up    One year follow up right TKA DOS 03/07/12    Encounter Diagnosis  Name Primary?  . S/P knee replacement Yes    HPI One year status post knee replacement here for followup films no complaints functioning well with all activities  Review of Systems History of spondylosis and spinal stenosis followed by Dr. Vertell Limber occasionally symptomatic    Objective:   Physical Exam General appearance is normal, the patient is alert and oriented x3 with normal mood and affect. The right knee looks good there is no swelling he did have a bruise where he hit the knee couple days ago has no affect on his function his knee flexion ARC is 115 his knee is stable in flexion and extension skin incision healed nicely distal pulses are intact    Assessment:     X-rays show stable prosthesis with slight tilt of the tibial tray    Plan:     Stable functioning right total knee

## 2013-03-09 NOTE — Patient Instructions (Signed)
activities as tolerated 

## 2014-03-08 ENCOUNTER — Encounter: Payer: Self-pay | Admitting: Orthopedic Surgery

## 2014-03-08 ENCOUNTER — Ambulatory Visit (INDEPENDENT_AMBULATORY_CARE_PROVIDER_SITE_OTHER): Payer: 59

## 2014-03-08 ENCOUNTER — Ambulatory Visit (INDEPENDENT_AMBULATORY_CARE_PROVIDER_SITE_OTHER): Payer: 59 | Admitting: Orthopedic Surgery

## 2014-03-08 VITALS — BP 114/65 | Ht 70.0 in | Wt 232.0 lb

## 2014-03-08 DIAGNOSIS — Z96651 Presence of right artificial knee joint: Secondary | ICD-10-CM

## 2014-03-08 NOTE — Progress Notes (Signed)
Patient ID: Randy Pena, male   DOB: 1940/06/16, 74 y.o.   MRN: [id number] Chief Complaint  Patient presents with  . Follow-up    yearly recheck + xray Rt TKA, DOS 03/07/12    Encounter Diagnosis  Name Primary?  . History of knee replacement, total, right Yes    BP 114/65 mmHg  Ht 5\' 10"  (1.778 m)  Wt 232 lb (105.235 kg)  BMI 33.29 kg/m2  The patient is 2 years post right total knee doing well no problems he does have a little bit of difficulty squatting but his pain is controlled doesn't use any assistive devices and he's progressing well in all his functional activities  Vital signs are stable as noted is oriented 3 ambulation is without assistive device has no swelling no tenderness is a flexion is 110 knee is stable in extension and flexion motor exam is normal skin incision clean dry and intact  X-rays today show stable prosthesis  Recommend one year follow-up

## 2015-02-13 ENCOUNTER — Encounter: Payer: Self-pay | Admitting: *Deleted

## 2015-03-12 ENCOUNTER — Ambulatory Visit: Payer: 59 | Admitting: Orthopedic Surgery

## 2015-03-18 ENCOUNTER — Ambulatory Visit (INDEPENDENT_AMBULATORY_CARE_PROVIDER_SITE_OTHER): Payer: Medicare Other | Admitting: Orthopedic Surgery

## 2015-03-18 ENCOUNTER — Ambulatory Visit (INDEPENDENT_AMBULATORY_CARE_PROVIDER_SITE_OTHER): Payer: Medicare Other

## 2015-03-18 VITALS — BP 115/61 | Ht 70.0 in | Wt 213.0 lb

## 2015-03-18 DIAGNOSIS — Z96651 Presence of right artificial knee joint: Secondary | ICD-10-CM

## 2015-03-18 NOTE — Progress Notes (Signed)
Patient ID: Randy Pena, male   DOB: 11-26-1940, 75 y.o.   MRN: [id number]  Post op annual TKA   Chief Complaint  Patient presents with  . Follow-up    yearly follow up + xray right TKA, DOS 03/07/12    HPI Randy Pena is a 75 y.o. male.  Doing well no pain    Past Medical History  Diagnosis Date  . Spinal stenosis   . DJD (degenerative joint disease)   . HTN (hypertension)   . 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 Chest wall biopsy      melanoma-[facility]  . Joint replacement      total right knee replacement today  . Total knee arthroplasty  03/07/2012    Procedure: TOTAL KNEE ARTHROPLASTY;  Surgeon: Carole Civil, MD;  Location: [facility];  Service: Orthopedics;  Laterality: Right;  depuy     No Known Allergies  Current Outpatient Prescriptions  Medication Sig Dispense Refill  . aspirin EC 325 MG EC tablet Take 1 tablet (325 mg total) by mouth 2 (two) times daily. 90 tablet 0  . lisinopril (PRINIVIL,ZESTRIL) 20 MG tablet Take 20 mg by mouth daily.      . Multiple Vitamin (MULTIVITAMIN PO) Take 1 tablet by mouth daily.      No current facility-administered medications for this visit.    Review of Systems Review of Systems  Musculoskeletal: Positive for joint swelling.     Physical Exam Blood pressure 115/61, height 5\' 10"  (1.778 m), weight 213 lb (96.616 kg).   Gen. appearance is normal there are no congenital abnormalities   The patient is oriented 3   Mood and affect are normal   Ambulation is without assistive device   Knee inspection reveals a well-healed incision with no swelling   Knee flexion 110  Stability in the anteroposterior plane is normal as well as in the medial lateral plane  Motor exam reveals full extension without extensor lag   Data Reviewed KNEE XRAYS : normal todau   Assessment S/P TKA   Plan    F/u 1 yr annual  knee-left        Randy Pena 03/18/2015, 9:58 AM

## 2016-03-17 ENCOUNTER — Ambulatory Visit (INDEPENDENT_AMBULATORY_CARE_PROVIDER_SITE_OTHER): Payer: Medicare Other

## 2016-03-17 ENCOUNTER — Ambulatory Visit (INDEPENDENT_AMBULATORY_CARE_PROVIDER_SITE_OTHER): Payer: Medicare Other | Admitting: Orthopedic Surgery

## 2016-03-17 ENCOUNTER — Encounter: Payer: Self-pay | Admitting: Orthopedic Surgery

## 2016-03-17 DIAGNOSIS — M1710 Unilateral primary osteoarthritis, unspecified knee: Secondary | ICD-10-CM

## 2016-03-17 DIAGNOSIS — Z96651 Presence of right artificial knee joint: Secondary | ICD-10-CM | POA: Diagnosis not present

## 2016-03-17 NOTE — Progress Notes (Signed)
ANNUAL FOLLOW UP FOR  RIGHT  TKA   Chief Complaint  Patient presents with  . Follow-up    RIGHT TKA, DOS 03/07/12     HPI: The patient is here for the annual  follow-up x-ray for knee replacement  NO COMPLAINTS   System review BACK PAIN WITH RIGHT LEG NUMBNESS   Examination of the RIGHT  KNEE   Inspection shows : incision healed nicely without erythema, no tenderness no swelling  Range of motion total range of motion is 115  Stability the knee is stable anterior to posterior as well as medial to lateral  Strength quadriceps strength is normal  Skin no erythema around the skin incision  Cardiovascular NO EDEMA    Medical decision-making section  X-rays ordered with the following personal interpretation  STABLE NO LOOSENING   Diagnosis  Plan follow-up 1 year repeat x-rays

## 2016-04-16 ENCOUNTER — Other Ambulatory Visit: Payer: Self-pay | Admitting: Neurosurgery

## 2016-05-08 NOTE — H&P (Signed)
Patient ID:   [id number] Patient: Randy Pena  Date of Birth: 04-17-1940 Visit Type: Office Visit   Date: 04/15/2016 01:15 PM Provider: Marchia Meiers. Vertell Limber MD   This 76 year old male presents for back pain.  History of Present Illness: 1.  back pain    The patient comes in to review his MRI.  04/15/16 MRI:  Disc degeneration throughout lumbar spine, severe spinal cord compression L4-5 right>left, severe spinal cord compression at L3-4, moderate canal stenosis at L2-3, and retrolisthesis at L2-3 and L3-4.      Medical/Surgical/Interim History Reviewed, no change.  Last detailed document date:03/30/2016.   Family History: Reviewed, no changes.  Last detailed document: 03/30/2016.   Social History: Reviewed, no changes. Last detailed document date: 03/30/2016.      MEDICATIONS(added, continued or stopped this visit): Started Medication Directions Instruction Stopped   Aspir-81 81 mg tablet,delayed release take 1 tablet by oral route  every day     lisinopril 20 mg tablet take 1 tablet by oral route  every day     multivitamin tablet take 1 tablet by oral route  every day       ALLERGIES:    Vitals Date Temp F BP Pulse Ht In Wt Lb BMI BSA Pain Score  04/15/2016  142/83 65 70 214 30.71  7/10      IMPRESSION The patient comes in to review his MRI.  He notes continued right LE and lumbar pain with associated right LE weakness.  He has a significant loss of function of right foot.  Occasionally, he takes aleve.  His MRI shows disc degeneration throughout lumbar spine, severe spinal cord compression L4-5 right>left, severe spinal cord compression at L3-4, moderate canal stenosis at L2-3, and retrolisthesis at L2-3 and L3-4.  On confrontational testing, he continues to have right EHL weakness.  Due to the severity of his stenosis and loss of function I believe surgical intervention is needed at this time. Scheduled L2-5 posterior fusion and decompression.    Pain  Assessment/Treatment Pain Scale: 7/10. Method: Numeric Pain Intensity Scale. Location: lower back, right leg. Onset: 09/28/2015. Duration: varies. Quality: numbness.  Fall Risk Plan The patient has not fallen in the last year.  Nurse education given.  Scheduled L2-5 posterior fusion and decompression.  Fit for LSO brace.             Provider:  Marchia Meiers. Vertell Limber MD  04/15/2016 02:07 PM Dictation edited by: Daine Gravel    CC Providers: Randy Levine MD [address] [place], [place] [address]              Electronically signed by Marchia Meiers. Vertell Limber MD on 04/15/2016 03:24 PM  Patient ID:   [id number] Patient: Randy Pena  Date of Birth: Dec 01, 1940 Visit Type: Office Visit   Date: 03/30/2016 11:30 AM Provider: Marchia Meiers. Vertell Limber MD   This 76 year old male presents for back pain.  History of Present Illness: 1.  back pain    Randy Pena harder, 76 year old retired male returns for evaluation of lumbar pain and right leg numbness, tingling, and weakness.  Patient notes symptoms have increased over the past 6 months, recalling no injury.  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 Last seen in 2012, patient notes he did receive some relief with a 2nd lumbar injection.  ESI's 2012  History:  HTN, melanoma left chest (q6 months follow ups), carotid plaques bilaterally (followed by Dr. Scotty Pena) Surgical history:  Melanoma left chest April 2013;  right TKR January 2014; retina reattached June 2017  X-rays on canopy        PAST MEDICAL/SURGICAL HISTORY   (Detailed)  Disease/disorder Onset Date Management Date Comments    Retina repair      Knee replacement    Hypertension         Family History  (Detailed) Relationship Family Member Name Deceased Age at Death Condition Onset Age Cause of Death  Father  Y  Cancer, unknown  N  Father  Y      Mother  Y        SOCIAL HISTORY  (Detailed) Preferred language is Unknown.   HOME  ENVIRONMENT/SAFETY The patient has not fallen in the last year.        MEDICATIONS(added, continued or stopped this visit): Started Medication Directions Instruction Stopped   Aspir-81 81 mg tablet,delayed release take 1 tablet by oral route  every day     lisinopril 20 mg tablet take 1 tablet by oral route  every day     multivitamin tablet take 1 tablet by oral route  every day       ALLERGIES: Ingredient Reaction Medication Name Comment  NO KNOWN ALLERGIES     No known allergies.    Vitals Date Temp F BP Pulse Ht In Wt Lb BMI BSA Pain Score  03/30/2016  161/91 65 70 216 30.99  6/10     PHYSICAL EXAM General Level of Distress: no acute distress Overall Appearance: normal    Cardiovascular Cardiac: regular rate and rhythm without murmur  Respiratory Lungs: clear to auscultation  Neurological Recent and Remote Memory: normal Attention Span and Concentration:   normal Language: normal Fund of Knowledge: normal  Right Left Sensation: normal normal Upper Extremity Coordination: normal normal  Lower Extremity Coordination: normal normal  Musculoskeletal Gait and Station: normal  Right Left Upper Extremity Muscle Strength: normal normal Lower Extremity Muscle Strength: normal normal Upper Extremity Muscle Tone:  normal normal Lower Extremity Muscle Tone: normal normal  Motor Strength Upper and lower extremity motor strength was tested in the clinically pertinent muscles. Any abnormal findings will be noted below.   Right Left EHL: 4-/5 normal   Deep Tendon Reflexes  Right Left Biceps: normal normal Triceps: normal normal Brachioradialis: normal normal Patellar: normal normal Achilles: normal normal  Sensory Sensation was tested at L1 to S1.   Cranial Nerves II. Optic Nerve/Visual Fields: normal III. Oculomotor: normal IV. Trochlear: normal V. Trigeminal: normal VI. Abducens: normal VII. Facial: normal VIII.  Acoustic/Vestibular: normal IX. Glossopharyngeal: normal X. Vagus: normal XI. Spinal Accessory: normal XII. Hypoglossal: normal  Motor and other Tests Lhermittes: negative Rhomberg: negative    Right Left Hoffman's: normal normal Clonus: normal normal Babinski: normal normal SLR: positive at 60 degrees negative Patrick's Corky Sox): negative negative Toe Walk: normal normal Toe Lift: normal normal Heel Walk: normal normal SI Joint: nontender nontender   Additional Findings:  UE strength is normal, 4/5 right hip abductor, left hip abductor strength is normal, S1 numbness on the right at proximal heal.     IMPRESSION The patient comes in with lumbar pain and right leg numbness that radiates into his right toe.  He also reports some weakness in his right LE.  His X-rays show L4-5 anterolisthesis with 10.7 mm neutral, 10.7 mm flexion, and 10.4 mm on extension.  There is also retrolisthesis at L3-4 and retrolisthesis of L2-3.  Additionally he has bone spurs at L2-3 and L3-4 bilaterally.  He reports that a  right L5 block proved beneficial in the past.   On confrontational testing,  he has a 4-/5 EHL on right, left EHL strength is normal, UE strength is normal, negative SLR on left, postive SLR on the right at 60 degrees, 4/5 right hip abductor, left hip abductor strength is normal, and S1 numbness on the right at proximal heal.  Ordered lumbar MRI because his 2011 MRI showed severe L4-5 spinal stenosis and I suspect it has worsened.  Follow up with me after to discuss.   Completed Orders (this encounter) Order Details Reason Side Interpretation Result Initial Treatment Date Region  Hypertension education Patient to follow up with primary care provider.        Lifestyle education regarding diet Patient encouraged to eat a well balanced diet.        Lumbar Spine- AP/Lat/Flex/Ex      03/30/2016 All Levels to All Levels   Assessment/Plan # Detail Type Description   1. Assessment Essential  (primary) hypertension (I10).       2. Assessment Body mass index (BMI) 30.0-30.9, adult (Z68.30).   Plan Orders Today's instructions / counseling include(s) Lifestyle education regarding diet.       3. Assessment Lumbar radiculopathy (M54.16).         Pain Assessment/Treatment Pain Scale: 6/10. Method: Numeric Pain Intensity Scale. Location: leg and toes. Onset: 09/28/2015. Duration: varies. Quality: numbness. Pain Assessment/Treatment follow-up plan of care: Patient taking medication as prescribed..  Fall Risk Plan The patient has not fallen in the last year.  Ordered lumbar MRI with and without contrast.  Follow up with me after to discuss.  Orders: Diagnostic Procedures: Assessment Procedure  M54.16 Lumbar Spine- AP/Lat/Flex/Ex  Instruction(s)/Education: Assessment Instruction  I10 Hypertension education  Z68.30 Lifestyle education regarding diet             Provider:  Marchia Meiers. Vertell Limber MD  03/30/2016 12:13 PM Dictation edited by: Daine Gravel    CC Providers: Randy Levine MD [address] [place], [place] [address]              Electronically signed by Marchia Meiers. Vertell Limber MD on 03/31/2016 05:44 PM

## 2016-05-20 ENCOUNTER — Encounter (HOSPITAL_COMMUNITY): Payer: Self-pay

## 2016-05-20 NOTE — Pre-Procedure Instructions (Signed)
OLANDER FRIEDL  05/20/2016      [place], [place] [address] Phone: [phone] Fax: [phone]    Your procedure is scheduled on Thur. April 19 at 0930 AM .  Report to [facility] at 0730 AM.  Call this number if you have problems the morning of surgery:  [phone]   Remember:  Do not eat food or drink liquids after midnight.  Take these medicines the morning of surgery with A SIP OF WATER Hydrocodone (norco)-if needed for pain.  Take all other medications as prescribed except 7 days prior to surgery STOP taking any Aspirin, Aleve, Naproxen, Ibuprofen, Motrin, Advil, Goody's, BC's, all herbal medications, fish oil, and all vitamins   Do not wear jewelry, make-up or nail polish.  Do not wear lotions, powders, or perfumes, or deoderant.             Men may shave face and neck.  Do not bring valuables to the hospital.  [facility] is not responsible for any belongings or valuables.  Contacts, dentures or bridgework may not be worn into surgery.  Leave your suitcase in the car.  After surgery it may be brought to your room.  For patients admitted to the hospital, discharge time will be determined by your treatment team.  Patients discharged the day of surgery will not be allowed to drive home.   Special instructions:  [facility]- Preparing For Surgery  Before surgery, you can play an important role. Because skin is not sterile, your skin needs to be as free of germs as possible. You can reduce the number of germs on your skin by washing with CHG (chlorahexidine gluconate) Soap before surgery.  CHG is an antiseptic cleaner which kills germs and bonds with the skin to continue killing germs even after washing.  Please do not use if you have an allergy to CHG or antibacterial soaps. If your skin becomes reddened/irritated stop using the CHG.  Do not shave (including legs and underarms) for at least  48 hours prior to first CHG shower. It is OK to shave your face.  Please follow these instructions carefully.   1. Shower the NIGHT BEFORE SURGERY and the MORNING OF SURGERY with CHG.   2. If you chose to wash your hair, wash your hair first as usual with your normal shampoo.  3. After you shampoo, rinse your hair and body thoroughly to remove the shampoo.  4. Use CHG as you would any other liquid soap. You can apply CHG directly to the skin and wash gently with a scrungie or a clean washcloth.   5. Apply the CHG Soap to your body ONLY FROM THE NECK DOWN.  Do not use on open wounds or open sores. Avoid contact with your eyes, ears, mouth and genitals (private parts). Wash genitals (private parts) with your normal soap.  6. Wash thoroughly, paying special attention to the area where your surgery will be performed.  7. Thoroughly rinse your body with warm water from the neck down.  8. DO NOT shower/wash with your normal soap after using and rinsing off the CHG Soap.  9. Pat yourself dry with a CLEAN TOWEL.   10. Wear CLEAN PAJAMAS   11. Place CLEAN SHEETS on your bed the night of your first shower and DO NOT SLEEP WITH PETS.    Day of Surgery: Do not apply any deodorants/lotions. Please  wear clean clothes to the hospital/surgery center.     Please read over the following fact sheets that you were given. Pain Booklet, Coughing and Deep Breathing, MRSA Information and Surgical Site Infection Prevention

## 2016-05-21 ENCOUNTER — Encounter (HOSPITAL_COMMUNITY)
Admission: RE | Admit: 2016-05-21 | Discharge: 2016-05-21 | Disposition: A | Discharge status: Home / Self Care | Payer: Medicare Other | Source: Ambulatory Visit | Attending: Neurosurgery | Admitting: Neurosurgery

## 2016-05-21 ENCOUNTER — Encounter (HOSPITAL_COMMUNITY): Payer: Self-pay

## 2016-05-21 DIAGNOSIS — M5136 Other intervertebral disc degeneration, lumbar region: Secondary | ICD-10-CM | POA: Diagnosis not present

## 2016-05-21 DIAGNOSIS — Z01812 Encounter for preprocedural laboratory examination: Secondary | ICD-10-CM | POA: Insufficient documentation

## 2016-05-21 DIAGNOSIS — Z7982 Long term (current) use of aspirin: Secondary | ICD-10-CM | POA: Diagnosis not present

## 2016-05-21 DIAGNOSIS — G9529 Other cord compression: Secondary | ICD-10-CM | POA: Insufficient documentation

## 2016-05-21 DIAGNOSIS — R531 Weakness: Secondary | ICD-10-CM | POA: Insufficient documentation

## 2016-05-21 DIAGNOSIS — Z01810 Encounter for preprocedural cardiovascular examination: Secondary | ICD-10-CM | POA: Diagnosis present

## 2016-05-21 DIAGNOSIS — M48061 Spinal stenosis, lumbar region without neurogenic claudication: Secondary | ICD-10-CM | POA: Diagnosis not present

## 2016-05-21 LAB — CBC
HEMATOCRIT: 39 % (ref 39.0–52.0)
HEMOGLOBIN: 13.7 g/dL (ref 13.0–17.0)
MCH: 33.7 pg (ref 26.0–34.0)
MCHC: 35.1 g/dL (ref 30.0–36.0)
MCV: 95.8 fL (ref 78.0–100.0)
Platelets: 240 10*3/uL (ref 150–400)
RBC: 4.07 MIL/uL — ABNORMAL LOW (ref 4.22–5.81)
RDW: 13.7 % (ref 11.5–15.5)
WBC: 4.4 10*3/uL (ref 4.0–10.5)

## 2016-05-21 LAB — BASIC METABOLIC PANEL
ANION GAP: 8 (ref 5–15)
BUN: 16 mg/dL (ref 6–20)
CHLORIDE: 107 mmol/L (ref 101–111)
CO2: 21 mmol/L — ABNORMAL LOW (ref 22–32)
Calcium: 9.3 mg/dL (ref 8.9–10.3)
Creatinine, Ser: 0.94 mg/dL (ref 0.61–1.24)
GFR calc Af Amer: >60 mL/min (ref >60)
GLUCOSE: 125 mg/dL — AB (ref 65–99)
POTASSIUM: 4.6 mmol/L (ref 3.5–5.1)
Sodium: 136 mmol/L (ref 135–145)

## 2016-05-21 LAB — TYPE AND SCREEN
ABO/RH(D): O POS
Antibody Screen: NEGATIVE

## 2016-05-21 LAB — ABO/RH: ABO/RH(D): O POS

## 2016-05-21 LAB — SURGICAL PCR SCREEN
MRSA, PCR: NEGATIVE
STAPHYLOCOCCUS AUREUS: POSITIVE — AB

## 2016-05-21 NOTE — Progress Notes (Signed)
Pt. Notified of results of nasal swab.  Rx. For mupirocin called to the [facility] in [place]

## 2016-05-21 NOTE — Progress Notes (Signed)
Denies any Cardiac problems Does not see a Cardiologist Denies any Cardiac Testing PCP Dr. Matthias Hughs

## 2016-05-28 ENCOUNTER — Inpatient Hospital Stay (HOSPITAL_COMMUNITY): Payer: Medicare Other | Admitting: Certified Registered"

## 2016-05-28 ENCOUNTER — Inpatient Hospital Stay (HOSPITAL_COMMUNITY)
Admission: RE | Admit: 2016-05-28 | Discharge: 2016-05-30 | DRG: 455 | Disposition: A | Discharge status: Home / Self Care | Payer: Medicare Other | Source: Ambulatory Visit | Attending: Neurosurgery | Admitting: Neurosurgery

## 2016-05-28 ENCOUNTER — Inpatient Hospital Stay (HOSPITAL_COMMUNITY): Payer: Medicare Other

## 2016-05-28 ENCOUNTER — Encounter (HOSPITAL_COMMUNITY): Payer: Self-pay | Admitting: General Practice

## 2016-05-28 ENCOUNTER — Inpatient Hospital Stay (HOSPITAL_COMMUNITY)
Admission: RE | Disposition: A | Discharge status: Home / Self Care | Payer: Self-pay | Source: Ambulatory Visit | Attending: Neurosurgery

## 2016-05-28 DIAGNOSIS — M48062 Spinal stenosis, lumbar region with neurogenic claudication: Principal | ICD-10-CM | POA: Diagnosis present

## 2016-05-28 DIAGNOSIS — Z79899 Other long term (current) drug therapy: Secondary | ICD-10-CM

## 2016-05-28 DIAGNOSIS — I10 Essential (primary) hypertension: Secondary | ICD-10-CM | POA: Diagnosis present

## 2016-05-28 DIAGNOSIS — Z96659 Presence of unspecified artificial knee joint: Secondary | ICD-10-CM | POA: Diagnosis present

## 2016-05-28 DIAGNOSIS — M4606 Spinal enthesopathy, lumbar region: Secondary | ICD-10-CM | POA: Diagnosis present

## 2016-05-28 DIAGNOSIS — Z85820 Personal history of malignant melanoma of skin: Secondary | ICD-10-CM | POA: Diagnosis not present

## 2016-05-28 DIAGNOSIS — Z419 Encounter for procedure for purposes other than remedying health state, unspecified: Secondary | ICD-10-CM

## 2016-05-28 DIAGNOSIS — M5416 Radiculopathy, lumbar region: Secondary | ICD-10-CM | POA: Diagnosis present

## 2016-05-28 DIAGNOSIS — M4316 Spondylolisthesis, lumbar region: Secondary | ICD-10-CM | POA: Diagnosis present

## 2016-05-28 DIAGNOSIS — M5116 Intervertebral disc disorders with radiculopathy, lumbar region: Secondary | ICD-10-CM | POA: Diagnosis present

## 2016-05-28 SURGERY — POSTERIOR LUMBAR FUSION 2 LEVEL
Anesthesia: General | Site: Back

## 2016-05-28 MED ORDER — MORPHINE SULFATE (PF) 4 MG/ML IV SOLN: 1.0000 mg | INTRAVENOUS | Status: DC | PRN

## 2016-05-28 MED ORDER — MEPERIDINE HCL 25 MG/ML IJ SOLN: 6.2500 mg | INTRAMUSCULAR | Status: DC | PRN

## 2016-05-28 MED ORDER — ADULT MULTIVITAMIN W/MINERALS CH
1.0000 | ORAL_TABLET | Freq: Every day | ORAL | Status: DC
  Administered 2016-05-29: 1 via ORAL
  Filled 2016-05-28: qty 1

## 2016-05-28 MED ORDER — CHLORHEXIDINE GLUCONATE CLOTH 2 % EX PADS: 6.0000 | MEDICATED_PAD | Freq: Once | CUTANEOUS | Status: DC

## 2016-05-28 MED ORDER — PHENYLEPHRINE HCL 10 MG/ML IJ SOLN
INTRAVENOUS | Status: DC | PRN
  Administered 2016-05-28: 30 ug/min via INTRAVENOUS

## 2016-05-28 MED ORDER — SODIUM CHLORIDE 0.9 % IJ SOLN
INTRAMUSCULAR | Status: DC | PRN
Start: 2016-05-28 — End: 2016-05-28
  Administered 2016-05-28: 20 mL via INTRAVENOUS

## 2016-05-28 MED ORDER — ZOLPIDEM TARTRATE 5 MG PO TABS
5.0000 mg | ORAL_TABLET | Freq: Every evening | ORAL | Status: DC | PRN
Start: 2016-05-28 — End: 2016-05-30

## 2016-05-28 MED ORDER — SODIUM CHLORIDE 0.9 % IV SOLN
INTRAVENOUS | Status: DC | PRN
  Administered 2016-05-28: 13:00:00 via INTRAVENOUS

## 2016-05-28 MED ORDER — SENNOSIDES-DOCUSATE SODIUM 8.6-50 MG PO TABS
1.0000 | ORAL_TABLET | Freq: Every evening | ORAL | Status: DC | PRN
  Administered 2016-05-29: 1 via ORAL
  Filled 2016-05-28: qty 1

## 2016-05-28 MED ORDER — THROMBIN 5000 UNITS EX SOLR
CUTANEOUS | Status: AC
  Filled 2016-05-28: qty 5000

## 2016-05-28 MED ORDER — HYDROCODONE-ACETAMINOPHEN 5-325 MG PO TABS
1.0000 | ORAL_TABLET | ORAL | Status: DC | PRN
  Administered 2016-05-28 – 2016-05-30 (×11): 2 via ORAL
  Filled 2016-05-28 (×11): qty 2

## 2016-05-28 MED ORDER — 0.9 % SODIUM CHLORIDE (POUR BTL) OPTIME
TOPICAL | Status: DC | PRN
Start: 2016-05-28 — End: 2016-05-28
  Administered 2016-05-28: 1000 mL

## 2016-05-28 MED ORDER — SODIUM CHLORIDE 0.9 % IV SOLN: 250.0000 mL | INTRAVENOUS | Status: DC

## 2016-05-28 MED ORDER — KCL IN DEXTROSE-NACL 20-5-0.45 MEQ/L-%-% IV SOLN
INTRAVENOUS | Status: DC
Start: 2016-05-28 — End: 2016-05-30
  Administered 2016-05-28: 17:00:00 via INTRAVENOUS
  Filled 2016-05-28 (×2): qty 1000

## 2016-05-28 MED ORDER — HYDROMORPHONE HCL 1 MG/ML IJ SOLN
0.2500 mg | INTRAMUSCULAR | Status: DC | PRN
Start: 2016-05-28 — End: 2016-05-28
  Administered 2016-05-28: 0.5 mg via INTRAVENOUS

## 2016-05-28 MED ORDER — FENTANYL CITRATE (PF) 100 MCG/2ML IJ SOLN
INTRAMUSCULAR | Status: DC | PRN
  Administered 2016-05-28 (×7): 50 ug via INTRAVENOUS
  Administered 2016-05-28: 150 ug via INTRAVENOUS
  Administered 2016-05-28 (×5): 50 ug via INTRAVENOUS

## 2016-05-28 MED ORDER — ARTIFICIAL TEARS OP OINT
TOPICAL_OINTMENT | OPHTHALMIC | Status: DC | PRN
  Administered 2016-05-28: 1 via OPHTHALMIC

## 2016-05-28 MED ORDER — FLEET ENEMA 7-19 GM/118ML RE ENEM: 1.0000 | ENEMA | Freq: Once | RECTAL | Status: DC | PRN

## 2016-05-28 MED ORDER — FENTANYL CITRATE (PF) 250 MCG/5ML IJ SOLN
INTRAMUSCULAR | Status: AC
Start: 2016-05-28 — End: 2016-05-28
  Filled 2016-05-28: qty 5

## 2016-05-28 MED ORDER — VANCOMYCIN HCL 1000 MG IV SOLR
INTRAVENOUS | Status: AC
  Filled 2016-05-28: qty 1000

## 2016-05-28 MED ORDER — EPHEDRINE 5 MG/ML INJ
INTRAVENOUS | Status: AC
  Filled 2016-05-28: qty 10

## 2016-05-28 MED ORDER — ONDANSETRON HCL 4 MG PO TABS: 4.0000 mg | ORAL_TABLET | Freq: Four times a day (QID) | ORAL | Status: DC | PRN

## 2016-05-28 MED ORDER — BUPIVACAINE LIPOSOME 1.3 % IJ SUSP
20.0000 mL | INTRAMUSCULAR | Status: DC
  Filled 2016-05-28: qty 20

## 2016-05-28 MED ORDER — SUGAMMADEX SODIUM 200 MG/2ML IV SOLN
INTRAVENOUS | Status: DC | PRN
  Administered 2016-05-28: 200 mg via INTRAVENOUS

## 2016-05-28 MED ORDER — ALBUMIN HUMAN 5 % IV SOLN
INTRAVENOUS | Status: DC | PRN
  Administered 2016-05-28 (×2): via INTRAVENOUS

## 2016-05-28 MED ORDER — EPHEDRINE SULFATE 50 MG/ML IJ SOLN
INTRAMUSCULAR | Status: DC | PRN
  Administered 2016-05-28: 5 mg via INTRAVENOUS
  Administered 2016-05-28: 10 mg via INTRAVENOUS
  Administered 2016-05-28: 5 mg via INTRAVENOUS
  Administered 2016-05-28 (×3): 10 mg via INTRAVENOUS

## 2016-05-28 MED ORDER — SODIUM CHLORIDE 0.9 % IJ SOLN
INTRAMUSCULAR | Status: AC
  Filled 2016-05-28: qty 20

## 2016-05-28 MED ORDER — ONDANSETRON HCL 4 MG/2ML IJ SOLN
INTRAMUSCULAR | Status: DC | PRN
  Administered 2016-05-28: 4 mg via INTRAVENOUS

## 2016-05-28 MED ORDER — ARTIFICIAL TEARS OP OINT
TOPICAL_OINTMENT | OPHTHALMIC | Status: AC
  Filled 2016-05-28: qty 3.5

## 2016-05-28 MED ORDER — DEXAMETHASONE SODIUM PHOSPHATE 10 MG/ML IJ SOLN
INTRAMUSCULAR | Status: DC | PRN
  Administered 2016-05-28: 10 mg via INTRAVENOUS

## 2016-05-28 MED ORDER — PROPOFOL 10 MG/ML IV BOLUS
INTRAVENOUS | Status: AC
  Filled 2016-05-28: qty 20

## 2016-05-28 MED ORDER — BUPIVACAINE HCL (PF) 0.5 % IJ SOLN
INTRAMUSCULAR | Status: AC
  Filled 2016-05-28: qty 30

## 2016-05-28 MED ORDER — ROCURONIUM BROMIDE 50 MG/5ML IV SOSY
PREFILLED_SYRINGE | INTRAVENOUS | Status: AC
  Filled 2016-05-28: qty 5

## 2016-05-28 MED ORDER — BUPIVACAINE HCL (PF) 0.5 % IJ SOLN
INTRAMUSCULAR | Status: DC | PRN
  Administered 2016-05-28: 10 mL

## 2016-05-28 MED ORDER — ONDANSETRON HCL 4 MG/2ML IJ SOLN: 4.0000 mg | Freq: Once | INTRAMUSCULAR | Status: DC | PRN

## 2016-05-28 MED ORDER — ROCURONIUM BROMIDE 100 MG/10ML IV SOLN
INTRAVENOUS | Status: DC | PRN
  Administered 2016-05-28: 50 mg via INTRAVENOUS

## 2016-05-28 MED ORDER — ALUM & MAG HYDROXIDE-SIMETH 200-200-20 MG/5ML PO SUSP
30.0000 mL | Freq: Four times a day (QID) | ORAL | Status: DC | PRN
  Filled 2016-05-28: qty 30

## 2016-05-28 MED ORDER — PROPOFOL 10 MG/ML IV BOLUS
INTRAVENOUS | Status: DC | PRN
  Administered 2016-05-28: 130 mg via INTRAVENOUS

## 2016-05-28 MED ORDER — SODIUM CHLORIDE 0.9% FLUSH
3.0000 mL | Freq: Two times a day (BID) | INTRAVENOUS | Status: DC
  Administered 2016-05-29: 3 mL via INTRAVENOUS

## 2016-05-28 MED ORDER — ACETAMINOPHEN 650 MG RE SUPP: 650.0000 mg | RECTAL | Status: DC | PRN

## 2016-05-28 MED ORDER — METHOCARBAMOL 1000 MG/10ML IJ SOLN
500.0000 mg | Freq: Four times a day (QID) | INTRAVENOUS | Status: DC | PRN
  Filled 2016-05-28: qty 5

## 2016-05-28 MED ORDER — LIDOCAINE-EPINEPHRINE 1 %-1:100000 IJ SOLN
INTRAMUSCULAR | Status: DC | PRN
  Administered 2016-05-28: 10 mL

## 2016-05-28 MED ORDER — PHENOL 1.4 % MT LIQD: 1.0000 | OROMUCOSAL | Status: DC | PRN

## 2016-05-28 MED ORDER — LACTATED RINGERS IV SOLN
INTRAVENOUS | Status: DC
  Administered 2016-05-28 (×2): via INTRAVENOUS

## 2016-05-28 MED ORDER — HYDROMORPHONE HCL 1 MG/ML IJ SOLN
INTRAMUSCULAR | Status: AC
  Filled 2016-05-28: qty 0.5

## 2016-05-28 MED ORDER — FENTANYL CITRATE (PF) 250 MCG/5ML IJ SOLN
INTRAMUSCULAR | Status: AC
  Filled 2016-05-28: qty 5

## 2016-05-28 MED ORDER — PANTOPRAZOLE SODIUM 40 MG IV SOLR
40.0000 mg | Freq: Every day | INTRAVENOUS | Status: DC
  Administered 2016-05-28: 40 mg via INTRAVENOUS
  Filled 2016-05-28: qty 40

## 2016-05-28 MED ORDER — BUPIVACAINE LIPOSOME 1.3 % IJ SUSP
INTRAMUSCULAR | Status: DC | PRN
  Administered 2016-05-28: 20 mL

## 2016-05-28 MED ORDER — ASPIRIN EC 81 MG PO TBEC
81.0000 mg | DELAYED_RELEASE_TABLET | Freq: Every day | ORAL | Status: DC
  Administered 2016-05-29 – 2016-05-30 (×2): 81 mg via ORAL
  Filled 2016-05-28 (×2): qty 1

## 2016-05-28 MED ORDER — LIDOCAINE HCL (CARDIAC) 20 MG/ML IV SOLN
INTRAVENOUS | Status: DC | PRN
Start: 2016-05-28 — End: 2016-05-28
  Administered 2016-05-28: 100 mg via INTRAVENOUS

## 2016-05-28 MED ORDER — HYDROCODONE-ACETAMINOPHEN 5-325 MG PO TABS: 1.0000 | ORAL_TABLET | Freq: Four times a day (QID) | ORAL | Status: DC | PRN

## 2016-05-28 MED ORDER — CEFAZOLIN SODIUM-DEXTROSE 2-4 GM/100ML-% IV SOLN
2.0000 g | Freq: Three times a day (TID) | INTRAVENOUS | Status: AC
  Administered 2016-05-28 – 2016-05-29 (×2): 2 g via INTRAVENOUS
  Filled 2016-05-28 (×2): qty 100

## 2016-05-28 MED ORDER — THROMBIN 20000 UNITS EX SOLR
CUTANEOUS | Status: AC
  Filled 2016-05-28: qty 20000

## 2016-05-28 MED ORDER — CEFAZOLIN SODIUM-DEXTROSE 2-4 GM/100ML-% IV SOLN
2.0000 g | INTRAVENOUS | Status: AC
  Administered 2016-05-28 (×2): 2 g via INTRAVENOUS
  Filled 2016-05-28: qty 100

## 2016-05-28 MED ORDER — THROMBIN 5000 UNITS EX SOLR
OROMUCOSAL | Status: DC | PRN
  Administered 2016-05-28 (×2): via TOPICAL

## 2016-05-28 MED ORDER — PHENYLEPHRINE 40 MCG/ML (10ML) SYRINGE FOR IV PUSH (FOR BLOOD PRESSURE SUPPORT)
PREFILLED_SYRINGE | INTRAVENOUS | Status: AC
Start: 2016-05-28 — End: 2016-05-28
  Filled 2016-05-28: qty 10

## 2016-05-28 MED ORDER — LISINOPRIL 20 MG PO TABS
20.0000 mg | ORAL_TABLET | Freq: Every day | ORAL | Status: DC
  Administered 2016-05-28: 20 mg via ORAL
  Filled 2016-05-28 (×2): qty 1

## 2016-05-28 MED ORDER — ACETAMINOPHEN 325 MG PO TABS: 650.0000 mg | ORAL_TABLET | ORAL | Status: DC | PRN

## 2016-05-28 MED ORDER — SODIUM CHLORIDE 0.9% FLUSH: 3.0000 mL | INTRAVENOUS | Status: DC | PRN

## 2016-05-28 MED ORDER — LIDOCAINE 2% (20 MG/ML) 5 ML SYRINGE
INTRAMUSCULAR | Status: AC
  Filled 2016-05-28: qty 5

## 2016-05-28 MED ORDER — ONDANSETRON HCL 4 MG/2ML IJ SOLN: 4.0000 mg | Freq: Four times a day (QID) | INTRAMUSCULAR | Status: DC | PRN

## 2016-05-28 MED ORDER — DOCUSATE SODIUM 100 MG PO CAPS
100.0000 mg | ORAL_CAPSULE | Freq: Two times a day (BID) | ORAL | Status: DC
  Administered 2016-05-28 – 2016-05-30 (×4): 100 mg via ORAL
  Filled 2016-05-28 (×4): qty 1

## 2016-05-28 MED ORDER — LIDOCAINE-EPINEPHRINE 1 %-1:100000 IJ SOLN
INTRAMUSCULAR | Status: AC
  Filled 2016-05-28: qty 1

## 2016-05-28 MED ORDER — METHOCARBAMOL 500 MG PO TABS
500.0000 mg | ORAL_TABLET | Freq: Four times a day (QID) | ORAL | Status: DC | PRN
  Administered 2016-05-28 – 2016-05-30 (×5): 500 mg via ORAL
  Filled 2016-05-28 (×5): qty 1

## 2016-05-28 MED ORDER — BISACODYL 10 MG RE SUPP: 10.0000 mg | Freq: Every day | RECTAL | Status: DC | PRN

## 2016-05-28 MED ORDER — MENTHOL 3 MG MT LOZG: 1.0000 | LOZENGE | OROMUCOSAL | Status: DC | PRN

## 2016-05-28 MED ORDER — THROMBIN 20000 UNITS EX SOLR
CUTANEOUS | Status: DC | PRN
  Administered 2016-05-28: 09:00:00 via TOPICAL

## 2016-05-28 SURGICAL SUPPLY — 88 items
ADH SKN CLS APL DERMABOND .7 (GAUZE/BANDAGES/DRESSINGS) ×1
BASKET BONE COLLECTION (BASKET) ×2 IMPLANT
BLADE CLIPPER SURG (BLADE) IMPLANT
BONE CANC CHIPS 40CC CAN1/2 (Bone Implant) ×3 IMPLANT
BUR MATCHSTICK NEURO 3.0 LAGG (BURR) ×3 IMPLANT
BUR PRECISION FLUTE 5.0 (BURR) ×3 IMPLANT
CAGE COROENT LG 10X9X23-12 (Cage) ×4 IMPLANT
CANISTER SUCT 3000ML PPV (MISCELLANEOUS) ×3 IMPLANT
CARTRIDGE OIL MAESTRO DRILL (MISCELLANEOUS) ×1 IMPLANT
CHIPS CANC BONE 40CC CAN1/2 (Bone Implant) ×1 IMPLANT
CONNECTOR RELINE O X  40-50MM (Connector) ×1 IMPLANT
CONNECTOR RELINE O X 40-50MM (Connector) IMPLANT
CONT SPEC 4OZ CLIKSEAL STRL BL (MISCELLANEOUS) ×7 IMPLANT
COVER BACK TABLE 60X90IN (DRAPES) ×3 IMPLANT
DECANTER SPIKE VIAL GLASS SM (MISCELLANEOUS) ×3 IMPLANT
DERMABOND ADVANCED (GAUZE/BANDAGES/DRESSINGS) ×2
DERMABOND ADVANCED .7 DNX12 (GAUZE/BANDAGES/DRESSINGS) ×1 IMPLANT
DIFFUSER DRILL AIR PNEUMATIC (MISCELLANEOUS) ×3 IMPLANT
DRAPE C-ARM 42X72 X-RAY (DRAPES) ×3 IMPLANT
DRAPE C-ARMOR (DRAPES) ×3 IMPLANT
DRAPE LAPAROTOMY 100X72X124 (DRAPES) ×3 IMPLANT
DRAPE POUCH INSTRU U-SHP 10X18 (DRAPES) ×3 IMPLANT
DRAPE SURG 17X23 STRL (DRAPES) ×3 IMPLANT
DRSG OPSITE POSTOP 4X10 (GAUZE/BANDAGES/DRESSINGS) ×2 IMPLANT
DURAPREP 26ML APPLICATOR (WOUND CARE) ×3 IMPLANT
ELECT BLADE 4.0 EZ CLEAN MEGAD (MISCELLANEOUS) ×3
ELECT REM PT RETURN 9FT ADLT (ELECTROSURGICAL) ×3
ELECTRODE BLDE 4.0 EZ CLN MEGD (MISCELLANEOUS) IMPLANT
ELECTRODE REM PT RTRN 9FT ADLT (ELECTROSURGICAL) ×1 IMPLANT
EVACUATOR 1/8 PVC DRAIN (DRAIN) ×2 IMPLANT
GAUZE SPONGE 4X4 12PLY STRL (GAUZE/BANDAGES/DRESSINGS) ×3 IMPLANT
GAUZE SPONGE 4X4 16PLY XRAY LF (GAUZE/BANDAGES/DRESSINGS) IMPLANT
GLOVE BIO SURGEON STRL SZ8 (GLOVE) ×6 IMPLANT
GLOVE BIOGEL PI IND STRL 8 (GLOVE) ×2 IMPLANT
GLOVE BIOGEL PI IND STRL 8.5 (GLOVE) ×2 IMPLANT
GLOVE BIOGEL PI INDICATOR 8 (GLOVE) ×4
GLOVE BIOGEL PI INDICATOR 8.5 (GLOVE) ×4
GLOVE ECLIPSE 7.0 STRL STRAW (GLOVE) ×2 IMPLANT
GLOVE ECLIPSE 8.0 STRL XLNG CF (GLOVE) ×6 IMPLANT
GLOVE EXAM NITRILE LRG STRL (GLOVE) IMPLANT
GLOVE EXAM NITRILE XL STR (GLOVE) IMPLANT
GLOVE EXAM NITRILE XS STR PU (GLOVE) IMPLANT
GLOVE INDICATOR 6.5 STRL GRN (GLOVE) ×4 IMPLANT
GLOVE INDICATOR 7.5 STRL GRN (GLOVE) ×6 IMPLANT
GLOVE SURG SS PI 7.0 STRL IVOR (GLOVE) ×2 IMPLANT
GOWN STRL REUS W/ TWL LRG LVL3 (GOWN DISPOSABLE) IMPLANT
GOWN STRL REUS W/ TWL XL LVL3 (GOWN DISPOSABLE) ×3 IMPLANT
GOWN STRL REUS W/TWL 2XL LVL3 (GOWN DISPOSABLE) ×4 IMPLANT
GOWN STRL REUS W/TWL LRG LVL3 (GOWN DISPOSABLE) ×9
GOWN STRL REUS W/TWL XL LVL3 (GOWN DISPOSABLE) ×6
GRAFT BNE CHIP CANC 1-8 40 (Bone Implant) IMPLANT
HEMOSTAT POWDER SURGIFOAM 1G (HEMOSTASIS) ×4 IMPLANT
KIT BASIN OR (CUSTOM PROCEDURE TRAY) ×3 IMPLANT
KIT POSITION SURG JACKSON T1 (MISCELLANEOUS) ×3 IMPLANT
KIT ROOM TURNOVER OR (KITS) ×3 IMPLANT
MILL MEDIUM DISP (BLADE) ×2 IMPLANT
MODULE POWER NUVASIVE (MISCELLANEOUS) IMPLANT
NDL HYPO 25X1 1.5 SAFETY (NEEDLE) ×1 IMPLANT
NDL SPNL 18GX3.5 QUINCKE PK (NEEDLE) IMPLANT
NEEDLE HYPO 25X1 1.5 SAFETY (NEEDLE) ×3 IMPLANT
NEEDLE SPNL 18GX3.5 QUINCKE PK (NEEDLE) ×3 IMPLANT
NS IRRIG 1000ML POUR BTL (IV SOLUTION) ×3 IMPLANT
OIL CARTRIDGE MAESTRO DRILL (MISCELLANEOUS) ×3
PACK LAMINECTOMY NEURO (CUSTOM PROCEDURE TRAY) ×3 IMPLANT
PAD ARMBOARD 7.5X6 YLW CONV (MISCELLANEOUS) ×9 IMPLANT
PATTIES SURGICAL .5 X.5 (GAUZE/BANDAGES/DRESSINGS) IMPLANT
PATTIES SURGICAL .5 X1 (DISPOSABLE) IMPLANT
PATTIES SURGICAL 1X1 (DISPOSABLE) IMPLANT
POWER MODULE NUVASIVE (MISCELLANEOUS) ×3
ROD RELINE-O 5.5X100 LORD (Rod) ×4 IMPLANT
SCREW LOCK RELINE 5.5 TULIP (Screw) ×16 IMPLANT
SCREW RELINE-O POLY 6.5X50MM (Screw) ×4 IMPLANT
SCREW RELINE-O POLY 7.5X50 (Screw) ×18 IMPLANT
SCREW RLINE PLY 2S 50X7.5XPA (Screw) IMPLANT
SPONGE LAP 4X18 X RAY DECT (DISPOSABLE) IMPLANT
SPONGE SURGIFOAM ABS GEL 100 (HEMOSTASIS) ×3 IMPLANT
STAPLER SKIN PROX WIDE 3.9 (STAPLE) ×2 IMPLANT
SUT VIC AB 1 CT1 18XBRD ANBCTR (SUTURE) ×2 IMPLANT
SUT VIC AB 1 CT1 8-18 (SUTURE) ×6
SUT VIC AB 2-0 CT1 18 (SUTURE) ×6 IMPLANT
SUT VIC AB 3-0 SH 8-18 (SUTURE) ×6 IMPLANT
SYR 30ML LL (SYRINGE) ×2 IMPLANT
SYR 5ML LL (SYRINGE) IMPLANT
TOWEL GREEN STERILE (TOWEL DISPOSABLE) ×2 IMPLANT
TOWEL GREEN STERILE FF (TOWEL DISPOSABLE) ×3 IMPLANT
TRAP SPECIMEN MUCOUS 40CC (MISCELLANEOUS) ×2 IMPLANT
TRAY FOLEY W/METER SILVER 16FR (SET/KITS/TRAYS/PACK) ×3 IMPLANT
WATER STERILE IRR 1000ML POUR (IV SOLUTION) ×3 IMPLANT

## 2016-05-28 NOTE — Op Note (Signed)
05/28/2016  2:18 PM  PATIENT:  Randy Pena  76 y.o. 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, degenerative disc disease, lumbago, radiculopathy L 23, L 34, L 45 levels  PROCEDURE:  Procedure(s): Lumbar two to Lumbar five Decompression fusion with screws and rods interbody lumbar three-four lumbar four-five fusion (N/A) with posterolateral arthrodesis  Decompression greater than for standard PLIF procedure  SURGEON:  Surgeon(s) and Role:    * Erline Levine, MD - Primary    * Consuella Lose, MD - Assisting  PHYSICIAN ASSISTANT:   ASSISTANTS: [place], RN   ANESTHESIA:   general  EBL:  Total I/O In: 2258 [I.V.:1500; Blood:258; IV Piggyback:500] Out: 1900 [Urine:1000; Blood:900]  BLOOD ADMINISTERED:358 CC CELLSAVER  DRAINS: (Medium) Hemovact drain(s) in the epidural space with  Suction Open   LOCAL MEDICATIONS USED:  MARCAINE    and LIDOCAINE   SPECIMEN:  No Specimen  DISPOSITION OF SPECIMEN:  N/A  COUNTS:  YES  TOURNIQUET:  * No tourniquets in log *  DICTATION:DICTATION: Patient is 76 year old man with  HNP, spondylosis, spondylolisthesis, stenosis, DDD, radiculopathy L 23, L 34, L 45. He has a severe bilateral leg pain and weakness. It was elected to take him to surgery for decompression and fusion at L 23, L 34, L 45 levels.    Procedure: Patient was placed in a prone position on the [place] table after smooth and uncomplicated induction of general endotracheal anesthesia. Her low back was prepped and draped in usual sterile fashion with DuraPrep. Area of incision was infiltrated with local lidocaine. Incision was made to the lumbodorsal fascia was incised and exposure was performed of the L23, L 34, L 45  spinous processes laminae facet joint  and transverse processes. Intraoperative x-ray was obtained which confirmed correct orientation. A total laminectomy of L2 , L 3, L 4  was performed with disarticulation of the facet joints at this level and thorough decompression was performed of both L2, L 3, L 4, L 5 nerve roots along with the common dural tube. There was densely adherent spondylytic material compressing the thecal sac and both L2, L 3, L 4, L 5  nerve roots.  Decompression was greater than would be typically performed for simple interbody fusion. A thorough discectomy and preparation of the endplates was performed at both the L 45 level.  The interspaces were packed with 10 x 23 x 12 degree PEEK cages  with 10 cc of autograft was packed within the interspace medial to the second cage. Thorough decompression was performed with facetectomy at L 34 bilaterally, but it was elected to not put cages at this level, given the degree of ankylosis at this level.   The posterolateral region was extensively decorticated and pedicle probes were placed at L2, L 3, L 4, L5 , L5  bilaterally. Intraoperative fluoroscopy confirmed correct orientationin the AP and lateral plane. 50 x 7.5 mm pedicle screws were placed at L 5, L 4, L 3  bilaterally and 50 x 6.5 mm screws placed at L 2 bilaterally. Final x-rays demonstrated well-positioned interbody grafts and pedicle screw fixation. 100 mm lordotic rods were placed and locked down in situ and crosslink was placed at L 23 level and the posterolateral region was packed with the remaining bone graft  on both sides (40 cc on each side of  autograft supplemented with allograft).  A medium Hemovac drain was placed and anchored with a stitch. Long-acting Marcaine was injected into the deep musculature.  Fascia was closed with 1 Vicryl sutures skin edges were reapproximated 2 and 3-0 Vicryl sutures. The wound is dressed with benzoin Steri-Strips Telfa gauze and tape. The patient was extubated in the operating room and taken  to recovery in stable satisfactory condition having tolerated the operation well. Counts were correct at the end of the case.    PLAN OF CARE: Admit to inpatient   PATIENT DISPOSITION:  PACU - hemodynamically stable.   Delay start of Pharmacological VTE agent (>24hrs) due to surgical blood loss or risk of bleeding: yes

## 2016-05-28 NOTE — Progress Notes (Signed)
Patient awake, slowly arousing.  MAEW with good strength.  Doing well.

## 2016-05-28 NOTE — Brief Op Note (Signed)
05/28/2016  2:18 PM  PATIENT:  Randy Pena  76 y.o. 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, degenerative disc disease, lumbago, radiculopathy L 23, L 34, L 45 levels  PROCEDURE:  Procedure(s): Lumbar two to Lumbar five Decompression fusion with screws and rods interbody lumbar three-four lumbar four-five fusion (N/A) with posterolateral arthrodesis  Decompression greater than for standard PLIF procedure  SURGEON:  Surgeon(s) and Role:    * Erline Levine, MD - Primary    * Consuella Lose, MD - Assisting  PHYSICIAN ASSISTANT:   ASSISTANTS: Poteat, RN   ANESTHESIA:   general  EBL:  Total I/O In: 2258 [I.V.:1500; Blood:258; IV Piggyback:500] Out: 1900 [Urine:1000; Blood:900]  BLOOD ADMINISTERED:358 CC CELLSAVER  DRAINS: (Medium) Hemovact drain(s) in the epidural space with  Suction Open   LOCAL MEDICATIONS USED:  MARCAINE    and LIDOCAINE   SPECIMEN:  No Specimen  DISPOSITION OF SPECIMEN:  N/A  COUNTS:  YES  TOURNIQUET:  * No tourniquets in log *  DICTATION:DICTATION: Patient is 76 year old man with  HNP, spondylosis, spondylolisthesis, stenosis, DDD, radiculopathy L 23, L 34, L 45. He has a severe bilateral leg pain and weakness. It was elected to take him to surgery for decompression and fusion at L 23, L 34, L 45 levels.    Procedure: Patient was placed in a prone position on the [place] table after smooth and uncomplicated induction of general endotracheal anesthesia. Her low back was prepped and draped in usual sterile fashion with DuraPrep. Area of incision was infiltrated with local lidocaine. Incision was made to the lumbodorsal fascia was incised and exposure was performed of the L23, L 34, L 45  spinous processes laminae facet joint  and transverse processes. Intraoperative x-ray was obtained which confirmed correct orientation. A total laminectomy of L2 , L 3, L 4  was performed with disarticulation of the facet joints at this level and thorough decompression was performed of both L2, L 3, L 4, L 5 nerve roots along with the common dural tube. There was densely adherent spondylytic material compressing the thecal sac and both L2, L 3, L 4, L 5  nerve roots.  Decompression was greater than would be typically performed for simple interbody fusion. A thorough discectomy and preparation of the endplates was performed at both the L 45 level.  The interspaces were packed with 10 x 23 x 12 degree PEEK cages  with 10 cc of autograft was packed within the interspace medial to the second cage. Thorough decompression was performed with facetectomy at L 34 bilaterally, but it was elected to not put cages at this level, given the degree of ankylosis at this level.   The posterolateral region was extensively decorticated and pedicle probes were placed at L2, L 3, L 4, L5 , L5  bilaterally. Intraoperative fluoroscopy confirmed correct orientationin the AP and lateral plane. 50 x 7.5 mm pedicle screws were placed at L 5, L 4, L 3  bilaterally and 50 x 6.5 mm screws placed at L 2 bilaterally. Final x-rays demonstrated well-positioned interbody grafts and pedicle screw fixation. 100 mm lordotic rods were placed and locked down in situ and crosslink was placed at L 23 level and the posterolateral region was packed with the remaining bone graft  on both sides (40 cc on each side of  autograft supplemented with allograft).  A medium Hemovac drain was placed and anchored with a stitch. Long-acting Marcaine was injected into the deep musculature.  Fascia was closed with 1 Vicryl sutures skin edges were reapproximated 2 and 3-0 Vicryl sutures. The wound is dressed with benzoin Steri-Strips Telfa gauze and tape. The patient was extubated in the operating room and taken  to recovery in stable satisfactory condition having tolerated the operation well. Counts were correct at the end of the case.    PLAN OF CARE: Admit to inpatient   PATIENT DISPOSITION:  PACU - hemodynamically stable.   Delay start of Pharmacological VTE agent (>24hrs) due to surgical blood loss or risk of bleeding: yes

## 2016-05-28 NOTE — Anesthesia Procedure Notes (Signed)
Procedure Name: Intubation Date/Time: 05/28/2016 9:25 AM Performed by: Lance Coon Pre-anesthesia Checklist: Patient identified, Emergency Drugs available, Suction available, Patient being monitored and Timeout performed Patient Re-evaluated:Patient Re-evaluated prior to inductionOxygen Delivery Method: Circle system utilized Preoxygenation: Pre-oxygenation with 100% oxygen Intubation Type: IV induction Ventilation: Mask ventilation without difficulty Laryngoscope Size: Miller and 3 Grade View: Grade I Tube size: 7.5 mm Number of attempts: 1 Airway Equipment and Method: Stylet Placement Confirmation: ETT inserted through vocal cords under direct vision,  positive ETCO2 and breath sounds checked- equal and bilateral Secured at: 22 cm Tube secured with: Tape Dental Injury: Teeth and Oropharynx as per pre-operative assessment

## 2016-05-28 NOTE — Interval H&P Note (Signed)
History and Physical Interval Note:  05/28/2016 7:34 AM  Randy Pena  has presented today for surgery, with the diagnosis of Lumbar stenosis with neurogenic claudication  The various methods of treatment have been discussed with the patient and family. After consideration of risks, benefits and other options for treatment, the patient has consented to  Procedure(s) with comments: L2 to L5 Decompression/L3-4 L4-5 Fusion (N/A) - L2 to L5 Decompression/L3-4 L4-5 Fusion as a surgical intervention .  The patient's history has been reviewed, patient examined, no change in status, stable for surgery.  I have reviewed the patient's chart and labs.  Questions were answered to the patient's satisfaction.     Randy Pena D

## 2016-05-28 NOTE — Anesthesia Preprocedure Evaluation (Signed)
Anesthesia Evaluation  Patient identified by MRN, date of birth, ID band Patient awake    Reviewed: Allergy & Precautions, NPO status , Patient's Chart, lab work & pertinent test results  Airway Mallampati: I  TM Distance: >3 FB Neck ROM: Full    Dental   Pulmonary    Pulmonary exam normal        Cardiovascular hypertension, Pt. on medications Normal cardiovascular exam     Neuro/Psych    GI/Hepatic   Endo/Other    Renal/GU      Musculoskeletal   Abdominal   Peds  Hematology   Anesthesia Other Findings   Reproductive/Obstetrics                             Anesthesia Physical Anesthesia Plan  ASA: II  Anesthesia Plan: General   Post-op Pain Management:    Induction: Intravenous  Airway Management Planned: Oral ETT  Additional Equipment:   Intra-op Plan:   Post-operative Plan: Extubation in OR  Informed Consent: I have reviewed the patients History and Physical, chart, labs and discussed the procedure including the risks, benefits and alternatives for the proposed anesthesia with the patient or authorized representative who has indicated his/her understanding and acceptance.     Plan Discussed with: CRNA and Surgeon  Anesthesia Plan Comments:         Anesthesia Quick Evaluation

## 2016-05-28 NOTE — Anesthesia Postprocedure Evaluation (Signed)
Anesthesia Post Note  Patient: ARLESS VINEYARD  Procedure(s) Performed: Procedure(s) (LRB): Lumbar two to Lumbar five Decompression fusion with screws and rods interbody lumbar three-four lumbar four-five fusion (N/A)  Patient location during evaluation: PACU Anesthesia Type: General Level of consciousness: awake and alert Pain management: pain level controlled Vital Signs Assessment: post-procedure vital signs reviewed and stable Respiratory status: spontaneous breathing, nonlabored ventilation, respiratory function stable and patient connected to nasal cannula oxygen Cardiovascular status: blood pressure returned to baseline and stable Postop Assessment: no signs of nausea or vomiting Anesthetic complications: no       Last Vitals:  Vitals:   05/28/16 1530 05/28/16 1545  BP: 100/62 100/62  Pulse: 77 77  Resp: 12 13  Temp:  36.4 C    Last Pain:  Vitals:   05/28/16 1545  PainSc: 3                  Kalayna Noy DAVID

## 2016-05-28 NOTE — Progress Notes (Signed)
Emptied 347mL from Hemovac during patient's PACU stay.  Surgical dressing remains clean, dry, and intact. Dr. Vertell Limber was made aware.  No new orders.

## 2016-05-28 NOTE — Transfer of Care (Signed)
Immediate Anesthesia Transfer of Care Note  Patient: Randy Pena  Procedure(s) Performed: Procedure(s): Lumbar two to Lumbar five Decompression fusion with screws and rods interbody lumbar three-four lumbar four-five fusion (N/A)  Patient Location: PACU  Anesthesia Type:General  Level of Consciousness: sedated and patient cooperative  Airway & Oxygen Therapy: Patient Spontanous Breathing and Patient connected to face mask oxygen  Post-op Assessment: Report given to RN and Post -op Vital signs reviewed and stable  Post vital signs: Reviewed and stable  Last Vitals:  Vitals:   05/28/16 0752 05/28/16 1430  BP: 134/76   Pulse: 61   Resp: 20 (P) 20  Temp: 36.7 C (P) 36.3 C    Last Pain:  Vitals:   05/28/16 0835  PainSc: 3       Patients Stated Pain Goal: 1 (69/45/03 8882)  Complications: No apparent anesthesia complications

## 2016-05-29 LAB — CBC WITH DIFFERENTIAL/PLATELET
BASOS ABS: 0 10*3/uL (ref 0.0–0.1)
Basophils Relative: 0 %
EOS PCT: 0 %
Eosinophils Absolute: 0 10*3/uL (ref 0.0–0.7)
HEMATOCRIT: 26.7 % — AB (ref 39.0–52.0)
Hemoglobin: 9.6 g/dL — ABNORMAL LOW (ref 13.0–17.0)
Lymphocytes Relative: 7 %
Lymphs Abs: 0.7 10*3/uL (ref 0.7–4.0)
MCH: 34.5 pg — ABNORMAL HIGH (ref 26.0–34.0)
MCHC: 36 g/dL (ref 30.0–36.0)
MCV: 96 fL (ref 78.0–100.0)
MONO ABS: 0.4 10*3/uL (ref 0.1–1.0)
Monocytes Relative: 5 %
NEUTROS ABS: 8.2 10*3/uL — AB (ref 1.7–7.7)
Neutrophils Relative %: 88 %
PLATELETS: 163 10*3/uL (ref 150–400)
RBC: 2.78 MIL/uL — ABNORMAL LOW (ref 4.22–5.81)
RDW: 14.1 % (ref 11.5–15.5)
WBC: 9.3 10*3/uL (ref 4.0–10.5)

## 2016-05-29 LAB — BASIC METABOLIC PANEL
ANION GAP: 6 (ref 5–15)
BUN: 20 mg/dL (ref 6–20)
CALCIUM: 8.1 mg/dL — AB (ref 8.9–10.3)
CO2: 25 mmol/L (ref 22–32)
Chloride: 100 mmol/L — ABNORMAL LOW (ref 101–111)
Creatinine, Ser: 0.94 mg/dL (ref 0.61–1.24)
Glucose, Bld: 150 mg/dL — ABNORMAL HIGH (ref 65–99)
Potassium: 4.7 mmol/L (ref 3.5–5.1)
Sodium: 131 mmol/L — ABNORMAL LOW (ref 135–145)

## 2016-05-29 MED ORDER — PANTOPRAZOLE SODIUM 40 MG PO TBEC
40.0000 mg | DELAYED_RELEASE_TABLET | Freq: Every day | ORAL | Status: DC
  Administered 2016-05-29: 40 mg via ORAL
  Filled 2016-05-29: qty 1

## 2016-05-29 MED FILL — Sodium Chloride Irrigation Soln 0.9%: Qty: 3000 | Status: AC

## 2016-05-29 MED FILL — Heparin Sodium (Porcine) Inj 1000 Unit/ML: INTRAMUSCULAR | Qty: 30 | Status: AC

## 2016-05-29 MED FILL — Sodium Chloride IV Soln 0.9%: INTRAVENOUS | Qty: 1000 | Status: AC

## 2016-05-29 NOTE — Evaluation (Signed)
Physical Therapy Evaluation Patient Details Name: Randy Pena MRN: [id number] DOB: [birth year] Today's Date: 05/29/2016   History of Present Illness  Pt is a 76 y/o male who presents s/p L2-L5 decompression and L3-L5 PLIF on 05/28/16.  Clinical Impression  Pt admitted with above diagnosis. Pt currently with functional limitations due to the deficits listed below (see PT Problem List). At the time of PT eval pt was able to perform transfers and ambulation with mod I to gross supervision for safety. Pt will need stair training prior to d/c, which pt is anticipating tomorrow. Pt will benefit from skilled PT to increase their independence and safety with mobility to allow discharge to the venue listed below.       Follow Up Recommendations Outpatient PT;Supervision - Intermittent    Equipment Recommendations  None recommended by PT    Recommendations for Other Services       Precautions / Restrictions Precautions Precautions: Fall;Back Precaution Booklet Issued: Yes (comment) Precaution Comments: Reviewed handout in detail. Pt was cued for precautions during functional mobility.  Restrictions Weight Bearing Restrictions: No      Mobility  Bed Mobility Overal bed mobility: Modified Independent Bed Mobility: Rolling;Sidelying to Sit Rolling: Modified independent (Device/Increase time) Sidelying to sit: Modified independent (Device/Increase time)       General bed mobility comments: Light cues for maintenance of precautions. Pt was able to complete transition to EOB with HOB flat and rails lowered to simulate home environment.   Transfers Overall transfer level: Needs assistance Equipment used: None Transfers: Sit to/from Stand Sit to Stand: Supervision         General transfer comment: Increased time and close supervision for safety.   Ambulation/Gait Ambulation/Gait assistance: Supervision Ambulation Distance (Feet): 400 Feet Assistive device: None;1 person hand held  assist Gait Pattern/deviations: Step-through pattern;Decreased stride length;Trunk flexed;Steppage;Decreased dorsiflexion - right Gait velocity: Decreased Gait velocity interpretation: Below normal speed for age/gender General Gait Details: VC's for improved posture, increased heel strike on R, and general safety awareness. Initially HHA provided with min guard however pt was able to quickly progress to supervision for safety without UE support.   Stairs            Wheelchair Mobility    Modified Rankin (Stroke Patients Only)       Balance Overall balance assessment: Needs assistance Sitting-balance support: Feet supported;No upper extremity supported Sitting balance-Leahy Scale: Fair     Standing balance support: No upper extremity supported;During functional activity Standing balance-Leahy Scale: Fair                               Pertinent Vitals/Pain Pain Assessment: Faces Faces Pain Scale: Hurts little more Pain Location: Incision site Pain Descriptors / Indicators: Operative site guarding;Discomfort Pain Intervention(s): Limited activity within patient's tolerance;Monitored during session;Repositioned    Home Living Family/patient expects to be discharged to:: Private residence Living Arrangements: Spouse/significant other Available Help at Discharge: Family;Available 24 hours/day Type of Home: House Home Access: Stairs to enter Entrance Stairs-Rails: Left Entrance Stairs-Number of Steps: 6 Home Layout: Two level;Able to live on main level with bedroom/bathroom Home Equipment: Gilford Rile - 2 wheels;Bedside commode;Hand held shower head (2 BSC - one for toilet, one for shower seat)      Prior Function Level of Independence: Independent               Hand Dominance   Dominant Hand: Right    Extremity/Trunk Assessment  Upper Extremity Assessment Upper Extremity Assessment: Overall WFL for tasks assessed    Lower Extremity  Assessment Lower Extremity Assessment: RLE deficits/detail RLE Deficits / Details: Slight foot drop noted. Pt compensating with steppage pattern    Cervical / Trunk Assessment Cervical / Trunk Assessment: Other exceptions Cervical / Trunk Exceptions: s/p surgery  Communication   Communication: No difficulties  Cognition Arousal/Alertness: Awake/alert Behavior During Therapy: WFL for tasks assessed/performed Overall Cognitive Status: Within Functional Limits for tasks assessed                                        General Comments      Exercises     Assessment/Plan    PT Assessment Patient needs continued PT services  PT Problem List Decreased strength;Decreased range of motion;Decreased activity tolerance;Decreased mobility;Decreased balance;Decreased knowledge of use of DME;Decreased safety awareness;Decreased knowledge of precautions;Pain       PT Treatment Interventions DME instruction;Gait training;Stair training;Functional mobility training;Therapeutic activities;Therapeutic exercise;Neuromuscular re-education;Patient/family education    PT Goals (Current goals can be found in the Care Plan section)  Acute Rehab PT Goals Patient Stated Goal: Home tomorrow PT Goal Formulation: With patient Time For Goal Achievement: 06/05/16 Potential to Achieve Goals: Good    Frequency Min 5X/week   Barriers to discharge        Co-evaluation               End of Session Equipment Utilized During Treatment: Gait belt Activity Tolerance: Patient tolerated treatment well Patient left: in chair;with call bell/phone within reach Nurse Communication: Mobility status PT Visit Diagnosis: Unsteadiness on feet (R26.81);Muscle weakness (generalized) (M62.81);Pain Pain - part of body:  (Back)    Time: 2549-8264 PT Time Calculation (min) (ACUTE ONLY): 28 min   Charges:   PT Evaluation $PT Eval Moderate Complexity: 1 Procedure PT Treatments $Gait Training:  8-22 mins   PT G Codes:        Rolinda Roan, PT, DPT Acute Rehabilitation Services Pager: [phone]   Thelma Comp 05/29/2016, 9:24 AM

## 2016-05-29 NOTE — Evaluation (Signed)
Occupational Therapy Evaluation Patient Details Name: Randy Pena MRN: [id number] DOB: [birth year] Today's Date: 05/29/2016    History of Present Illness Pt is a 76 y/o male who presents s/p L2-L5 decompression and L3-L5 PLIF on 05/28/16.   Clinical Impression   Pt admitted with the above diagnoses and presents with below problem list. Pt will benefit from continued acute OT to address the below listed deficits and maximize independence with basic ADLs prior to d/c home. PTA pt independent with ADLs. Pt currently min A for LB ADLs and bed mobility (HOB flat, no rails); min guard for functional mobility/transfers.       Follow Up Recommendations  Supervision - Intermittent    Equipment Recommendations  None recommended by OT    Recommendations for Other Services       Precautions / Restrictions Precautions Precautions: Fall;Back Precaution Booklet Issued: Yes (comment) Precaution Comments: reviewed Required Braces or Orthoses: Spinal Brace Spinal Brace: Lumbar corset;Applied in sitting position Restrictions Weight Bearing Restrictions: No      Mobility Bed Mobility Overal bed mobility: Needs Assistance Bed Mobility: Rolling;Sidelying to Sit;Sit to Sidelying Rolling: Modified independent (Device/Increase time) Sidelying to sit: Min assist;Min guard     Sit to sidelying: Min guard;Min assist General bed mobility comments: HOB flat. Assist to maintain precautions. powerup BLE onto bed during sit>sidelying. Spouse educated in how to assist. Rails down.  Transfers Overall transfer level: Needs assistance Equipment used: None Transfers: Sit to/from Stand Sit to Stand: Supervision         General transfer comment: Increased time and close supervision for safety.     Balance Overall balance assessment: Needs assistance Sitting-balance support: Feet supported;No upper extremity supported Sitting balance-Leahy Scale: Fair     Standing balance support: No upper  extremity supported;During functional activity Standing balance-Leahy Scale: Fair                             ADL either performed or assessed with clinical judgement   ADL Overall ADL's : Needs assistance/impaired Eating/Feeding: Set up;Sitting   Grooming: Set up;Standing;Supervision/safety;Cueing for compensatory techniques   Upper Body Bathing: Minimal assistance;Sitting   Lower Body Bathing: Minimal assistance;Sit to/from stand   Upper Body Dressing : Minimal assistance   Lower Body Dressing: Minimal assistance   Toilet Transfer: Ambulation;Min guard (BSC over toilet)   Toileting- Clothing Manipulation and Hygiene: Min guard;Supervision/safety;Sit to/from stand Toileting - Clothing Manipulation Details (indicate cue type and reason): educated on AE and techniques to maintain precautions during toileting tasks Tub/ Shower Transfer: Walk-in shower;Min guard   Functional mobility during ADLs: Min guard General ADL Comments: Pt completed bed mobility, in-room functional mobility, and toilet transfer as detailed above. Spouse present and included in session. ADL education provided.      Vision         Perception     Praxis      Pertinent Vitals/Pain Pain Assessment: 0-10 Pain Score: 6  Faces Pain Scale: Hurts little more Pain Location: Incision site Pain Descriptors / Indicators: Operative site guarding;Discomfort Pain Intervention(s): Limited activity within patient's tolerance;Monitored during session;Repositioned     Hand Dominance Right   Extremity/Trunk Assessment Upper Extremity Assessment Upper Extremity Assessment: Overall WFL for tasks assessed   Lower Extremity Assessment Lower Extremity Assessment: Defer to PT evaluation RLE Deficits / Details: Slight foot drop noted. Pt compensating with steppage pattern   Cervical / Trunk Assessment Cervical / Trunk Assessment: Other exceptions Cervical / Trunk  Exceptions: s/p surgery   Communication  Communication Communication: No difficulties   Cognition Arousal/Alertness: Awake/alert Behavior During Therapy: WFL for tasks assessed/performed Overall Cognitive Status: Within Functional Limits for tasks assessed                                     General Comments       Exercises     Shoulder Instructions      Home Living Family/patient expects to be discharged to:: Private residence Living Arrangements: Spouse/significant other Available Help at Discharge: Family;Available 24 hours/day Type of Home: House Home Access: Stairs to enter [employer] of Steps: 6 Entrance Stairs-Rails: Left Home Layout: Two level;Able to live on main level with bedroom/bathroom     Bathroom Shower/Tub: Occupational psychologist: Standard Bathroom Accessibility: Yes   Home Equipment: Environmental consultant - 2 wheels;Bedside commode;Hand held shower head          Prior Functioning/Environment Level of Independence: Independent                 OT Problem List: Decreased knowledge of use of DME or AE;Decreased knowledge of precautions;Pain;Impaired balance (sitting and/or standing)      OT Treatment/Interventions: Self-care/ADL training;DME and/or AE instruction;Therapeutic activities;Patient/family education;Balance training    OT Goals(Current goals can be found in the care plan section) Acute Rehab OT Goals Patient Stated Goal: Home tomorrow OT Goal Formulation: With patient/family Time For Goal Achievement: 06/05/16 Potential to Achieve Goals: Good ADL Goals Pt Will Perform Lower Body Bathing: with modified independence;sit to/from stand;with adaptive equipment Pt Will Perform Lower Body Dressing: with modified independence;with adaptive equipment;sit to/from stand Pt Will Perform Tub/Shower Transfer: Shower transfer;with supervision;ambulating;3 in 1 Additional ADL Goal #1: Pt will complete bed mobility at mod I level to prepare for OOB ADLs.   OT  Frequency: Min 2X/week   Barriers to D/C:            Co-evaluation              End of Session Equipment Utilized During Treatment: Back brace  Activity Tolerance: Patient tolerated treatment well Patient left: in bed;with call bell/phone within reach;with family/visitor present  OT Visit Diagnosis: Pain                Time: 7619-5093 OT Time Calculation (min): 19 min Charges:  OT General Charges $OT Visit: 1 Procedure OT Evaluation $OT Eval Low Complexity: 1 Procedure G-Codes:       Hortencia Pilar 05/29/2016, 12:36 PM

## 2016-05-29 NOTE — Progress Notes (Signed)
Introduced self on rounding as incoming nurse 3p-7p.  Call bell at reach.  Instructed to call for assistance.  Verbalized understanding.  Karie Kirks, Therapist, sports.

## 2016-05-29 NOTE — Progress Notes (Addendum)
Subjective: Patient reports "I feel pretty good...some pain, but not too bad"  Objective: Vital signs in last 24 hours: Temp:  [97 F (36.1 C)-97.9 F (36.6 C)] 97.9 F (36.6 C) (04/20 0400) Pulse Rate:  [64-97] 75 (04/20 0805) Resp:  [12-118] 18 (04/20 0805) BP: (85-124)/(50-85) 98/50 (04/20 0805) SpO2:  [95 %-100 %] 99 % (04/20 0805) Weight:  [96.2 kg (212 lb)] 96.2 kg (212 lb) (04/19 0835)  Intake/Output from previous day: 04/19 0701 - 04/20 0700 In: 3424.3 [I.V.:2666.3; Blood:258; IV Piggyback:500] Out: 1062 [Urine:1800; Drains:920; Blood:900] Intake/Output this shift: No intake/output data recorded.  Alert, conversant, MAEW. Asymptomatic of 98/S BP. Good strength BLE. Incision flat, without erythema or drainage beneath Dermabond and Honeycomb. Hemovac patent 115ml last 8hrs. Ambulated in hallway x2.   Lab Results: No results for input(s): WBC, HGB, HCT, PLT in the last 72 hours. BMET No results for input(s): NA, K, CL, CO2, GLUCOSE, BUN, CREATININE, CALCIUM in the last 72 hours.  Studies/Results: Dg Lumbar Spine 2-3 Views  Result Date: 05/28/2016 CLINICAL DATA:  Opening films for L2-L5 decompression and fusion. EXAM: LUMBAR SPINE - 2-3 VIEW COMPARISON:  MRI of the lumbar spine of April 15, 2016 and lumbar spine radiographs of March 30, 2016. FINDINGS: Numbering is in accordance with that used on the MRI. Film #1: The uppermost metallic trocar lies over the L3 spinous process and is approximately 2.6 cm posterior to the body of L3. The middle trocar has its tip overlying the posterior aspect of the pars at L4 which is 22 mm posterior to the posterior wall of the L4 vertebral body. The lower most trocar lies approximately 1.9 cm posterior to the posterior inferior aspect of the body of L5. Film #2: The uppermost trocar lies approximately 18 mm posterior to the midbody of L2. The middle trocar from the top lies approximately 1 cm posterior to the posterior margin of the L2-3 disc  space. The third from the top lies approximately 16 mm posterior to the L3-4 disc space. The fourth trocar lies approximately 2.6 cm posterior to the posterior inferior aspect of the body of L5 IMPRESSION: Intraoperative lateral lumbar localization images with findings as described. Electronically Signed   By: David  Martinique M.D.   On: 05/28/2016 11:18   Dg Lumbar Spine Complete  Result Date: 05/28/2016 CLINICAL DATA:  Lumbar decompression and fusion.  Elective surgery. EXAM: LUMBAR SPINE - COMPLETE 4+ VIEW; DG C-ARM 61-120 MIN COMPARISON:  None. FINDINGS: Intraoperative images demonstrates bilateral pedicle screws at L2, L3, L4, and L5. An interbody graft is noted at L4-5. Anterolisthesis at L4-5 is stable. The height is restored at L4-5. There is persistent loss of disc height at L2-3 and L3-4. IMPRESSION: 1. Bilateral pedicle screws at L2, L3, L4, and L5 without radiographic evidence for complication. 2. Interbody graft material at L4-5 with restoration of disc height and stable anterolisthesis. Electronically Signed   By: San Morelle M.D.   On: 05/28/2016 14:41   Dg C-arm 1-60 Min  Result Date: 05/28/2016 CLINICAL DATA:  Lumbar decompression and fusion.  Elective surgery. EXAM: LUMBAR SPINE - COMPLETE 4+ VIEW; DG C-ARM 61-120 MIN COMPARISON:  None. FINDINGS: Intraoperative images demonstrates bilateral pedicle screws at L2, L3, L4, and L5. An interbody graft is noted at L4-5. Anterolisthesis at L4-5 is stable. The height is restored at L4-5. There is persistent loss of disc height at L2-3 and L3-4. IMPRESSION: 1. Bilateral pedicle screws at L2, L3, L4, and L5 without radiographic evidence for complication.  2. Interbody graft material at L4-5 with restoration of disc height and stable anterolisthesis. Electronically Signed   By: San Morelle M.D.   On: 05/28/2016 14:41    Assessment/Plan: Improving  LOS: 1 day  Mobilize in LSO with PT. Will leave Hemovac in place this morning.     Verdis Prime 05/29/2016, 8:16 AM   Patient feels leg and foot strength much improved.  Strength in bilateral DF/EHL is full on exam this morning.  Patient is doing well.  Anticipate D/C in AM.

## 2016-05-30 MED ORDER — METHOCARBAMOL 500 MG PO TABS: 500.0000 mg | ORAL_TABLET | Freq: Four times a day (QID) | ORAL | 1 refills | Status: DC | PRN

## 2016-05-30 MED ORDER — HYDROCODONE-ACETAMINOPHEN 5-325 MG PO TABS: 1.0000 | ORAL_TABLET | ORAL | 0 refills | Status: DC | PRN

## 2016-05-30 NOTE — Progress Notes (Signed)
Patient alert and oriented, mae's well, voiding adequate amount of urine, swallowing without difficulty, no c/o pain at time of discharge. Patient discharged home with family. Script and discharged instructions given to patient. Patient and family stated understanding of instructions given. Patient has an appointment with Dr. Stern in 3 weeks 

## 2016-05-30 NOTE — Progress Notes (Signed)
Physical Therapy Treatment Patient Details Name: Randy Pena MRN: [id number] DOB: 1940/12/22 Today's Date: 05/30/2016    History of Present Illness Pt is a 76 y/o male who presents s/p L2-L5 decompression and L3-L5 PLIF on 05/28/16.    PT Comments    Patient seen for mobility progression. Performed stair negotiation, educated on car transfers and mobility expectations for discharge. Anticipate patient will be safe for d/c home.   Follow Up Recommendations  Supervision - Intermittent     Equipment Recommendations  None recommended by PT    Recommendations for Other Services       Precautions / Restrictions Precautions Precautions: Fall;Back Required Braces or Orthoses: Spinal Brace Spinal Brace: Lumbar corset;Applied in sitting position Restrictions Weight Bearing Restrictions: No    Mobility  Bed Mobility               General bed mobility comments: seated eob beginning and end of session  Transfers Overall transfer level: Needs assistance Equipment used: None Transfers: Sit to/from [facility] Sit to Stand: Supervision Stand pivot transfers: Supervision       General transfer comment: no physical assistance required  Ambulation/Gait Ambulation/Gait assistance: Independent   Assistive device: None Gait Pattern/deviations: Step-through pattern;Decreased stride length;Trunk flexed;Steppage;Decreased dorsiflexion - right Gait velocity: Decreased Gait velocity interpretation: Below normal speed for age/gender General Gait Details: decreased cadence   Stairs Stairs: Yes   Stair Management: One rail Left Number of Stairs: 12 General stair comments: Cues for step to sequencing  Wheelchair Mobility    Modified Rankin (Stroke Patients Only)       Balance Overall balance assessment: Needs assistance Sitting-balance support: Feet supported;No upper extremity supported Sitting balance-Leahy Scale: Fair     Standing balance  support: No upper extremity supported;During functional activity Standing balance-Leahy Scale: Fair                              Cognition Arousal/Alertness: Awake/alert Behavior During Therapy: WFL for tasks assessed/performed Overall Cognitive Status: Within Functional Limits for tasks assessed                                        Exercises      General Comments        Pertinent Vitals/Pain Pain Assessment: 0-10 Pain Score: 6  Pain Location: incisioin site Pain Descriptors / Indicators: Operative site guarding;Discomfort Pain Intervention(s): Premedicated before session;Monitored during session;Repositioned    Home Living                      Prior Function            PT Goals (current goals can now be found in the care plan section) Acute Rehab PT Goals Patient Stated Goal: to go home PT Goal Formulation: With patient Time For Goal Achievement: 06/05/16 Potential to Achieve Goals: Good Progress towards PT goals: Progressing toward goals    Frequency    Min 5X/week      PT Plan Current plan remains appropriate    Co-evaluation             End of Session Equipment Utilized During Treatment: Gait belt Activity Tolerance: Patient tolerated treatment well Patient left: in bed;with call bell/phone within reach (seated EOB) Nurse Communication: Mobility status PT Visit Diagnosis: Unsteadiness on feet (R26.81);Muscle weakness (generalized) (M62.81);Pain Pain -  part of body:  (Back)     Time: 9741-6384 PT Time Calculation (min) (ACUTE ONLY): 14 min  Charges:  $Gait Training: 8-22 mins                    G Codes:       Alben Deeds, PT DPT  [phone]    Duncan Dull 05/30/2016, 8:33 AM

## 2016-05-30 NOTE — Discharge Summary (Signed)
Physician Discharge Summary  Patient ID: Randy Pena MRN: [id number] DOB/AGE: Jan 21, 1941 76 y.o.  Admit date: 05/28/2016 Discharge date: 05/30/2016  Admission Diagnoses:Spondyolisthesis, stenosis, herniated disc, radiculopathy, lumbago L 23, L 34, L 45 levels  Discharge Diagnoses: Same Active Problems:   Spondylolisthesis of lumbar region   Discharged Condition: good  Hospital Course: Patient underwent decompression and fusion L 23, L 34, L45 levels with relief of leg pain and weakness.  He did well and was discharged home on POD 2.  Consults: None  Significant Diagnostic Studies: None  Treatments: surgery: decompression and fusion L 23, L 34, L45 levels  Discharge Exam: Blood pressure 113/64, pulse 73, temperature 98.2 F (36.8 C), temperature source Oral, resp. rate 18, height 5\' 10"  (1.778 m), weight 96.2 kg (212 lb), SpO2 96 %. Neurologic: Alert and oriented X 3, normal strength and tone. Normal symmetric reflexes. Normal coordination and gait Wound:CDI  Disposition: Home  Discharge Instructions    Diet - low sodium heart healthy    Complete by:  As directed    Increase activity slowly    Complete by:  As directed      Allergies as of 05/30/2016      Reactions   No Known Allergies       Medication List    TAKE these medications   aspirin EC 81 MG tablet Take 81 mg by mouth daily.   HYDROcodone-acetaminophen 5-325 MG tablet Commonly known as:  NORCO/VICODIN Take 1 tablet by mouth every 6 (six) hours as needed (for pain.). What changed:  Another medication with the same name was added. Make sure you understand how and when to take each.   HYDROcodone-acetaminophen 5-325 MG tablet Commonly known as:  NORCO/VICODIN Take 1-2 tablets by mouth every 4 (four) hours as needed (breakthrough pain). What changed:  You were already taking a medication with the same name, and this prescription was added. Make sure you understand how and when to take each.    lisinopril 20 MG tablet Commonly known as:  PRINIVIL,ZESTRIL Take 20 mg by mouth daily.   methocarbamol 500 MG tablet Commonly known as:  ROBAXIN Take 1 tablet (500 mg total) by mouth every 6 (six) hours as needed for muscle spasms.   multivitamin with minerals Tabs tablet Take 1 tablet by mouth daily.        Signed: Peggyann Shoals, MD 05/30/2016, 6:31 AM

## 2016-05-30 NOTE — Discharge Instructions (Signed)

## 2016-05-30 NOTE — Care Management Note (Signed)
Case Management Note  Patient Details  Name: Randy Pena MRN: [id number] Date of Birth: 06-26-1940  Subjective/Objective:                  L2-L5 decompression and L3-L5 PLIF Action/Plan: Discharge planning Expected Discharge Date:  05/30/16               Expected Discharge Plan:  Home/Self Care  In-House Referral:     Discharge planning Services  CM Consult  Post Acute Care Choice:  NA Choice offered to:  NA  DME Arranged:  N/A DME Agency:  NA  HH Arranged:  NA HH Agency:  NA  Status of Service:  Completed, signed off  If discussed at [place] of Stay Meetings, dates discussed:    Additional Comments: CM notes no DME recommended and no HH services recommended.  No other CM needs were communicated. Dellie Catholic, RN 05/30/2016, 8:43 AM

## 2016-05-30 NOTE — Progress Notes (Signed)
Subjective: Patient reports doing well  Objective: Vital signs in last 24 hours: Temp:  [97.8 F (36.6 C)-98.5 F (36.9 C)] 98.2 F (36.8 C) (04/21 0452) Pulse Rate:  [73-84] 73 (04/21 0452) Resp:  [18-20] 18 (04/21 0452) BP: (94-124)/(50-67) 113/64 (04/21 0452) SpO2:  [96 %-100 %] 96 % (04/21 0452)  Intake/Output from previous day: 04/20 0701 - 04/21 0700 In: 480 [P.O.:480] Out: 1350 [Urine:900; Drains:450] Intake/Output this shift: Total I/O In: -  Out: 230 [Drains:230]  Physical Exam: Strength full.  Dressing CDI.    Lab Results:  Recent Labs  05/29/16 0814  WBC 9.3  HGB 9.6*  HCT 26.7*  PLT 163   BMET  Recent Labs  05/29/16 0814  NA 131*  K 4.7  CL 100*  CO2 25  GLUCOSE 150*  BUN 20  CREATININE 0.94  CALCIUM 8.1*    Studies/Results: Dg Lumbar Spine 2-3 Views  Result Date: 05/28/2016 CLINICAL DATA:  Opening films for L2-L5 decompression and fusion. EXAM: LUMBAR SPINE - 2-3 VIEW COMPARISON:  MRI of the lumbar spine of April 15, 2016 and lumbar spine radiographs of March 30, 2016. FINDINGS: Numbering is in accordance with that used on the MRI. Film #1: The uppermost metallic trocar lies over the L3 spinous process and is approximately 2.6 cm posterior to the body of L3. The middle trocar has its tip overlying the posterior aspect of the pars at L4 which is 22 mm posterior to the posterior wall of the L4 vertebral body. The lower most trocar lies approximately 1.9 cm posterior to the posterior inferior aspect of the body of L5. Film #2: The uppermost trocar lies approximately 18 mm posterior to the midbody of L2. The middle trocar from the top lies approximately 1 cm posterior to the posterior margin of the L2-3 disc space. The third from the top lies approximately 16 mm posterior to the L3-4 disc space. The fourth trocar lies approximately 2.6 cm posterior to the posterior inferior aspect of the body of L5 IMPRESSION: Intraoperative lateral lumbar localization  images with findings as described. Electronically Signed   By: David  Martinique M.D.   On: 05/28/2016 11:18   Dg Lumbar Spine Complete  Result Date: 05/28/2016 CLINICAL DATA:  Lumbar decompression and fusion.  Elective surgery. EXAM: LUMBAR SPINE - COMPLETE 4+ VIEW; DG C-ARM 61-120 MIN COMPARISON:  None. FINDINGS: Intraoperative images demonstrates bilateral pedicle screws at L2, L3, L4, and L5. An interbody graft is noted at L4-5. Anterolisthesis at L4-5 is stable. The height is restored at L4-5. There is persistent loss of disc height at L2-3 and L3-4. IMPRESSION: 1. Bilateral pedicle screws at L2, L3, L4, and L5 without radiographic evidence for complication. 2. Interbody graft material at L4-5 with restoration of disc height and stable anterolisthesis. Electronically Signed   By: San Morelle M.D.   On: 05/28/2016 14:41   Dg C-arm 1-60 Min  Result Date: 05/28/2016 CLINICAL DATA:  Lumbar decompression and fusion.  Elective surgery. EXAM: LUMBAR SPINE - COMPLETE 4+ VIEW; DG C-ARM 61-120 MIN COMPARISON:  None. FINDINGS: Intraoperative images demonstrates bilateral pedicle screws at L2, L3, L4, and L5. An interbody graft is noted at L4-5. Anterolisthesis at L4-5 is stable. The height is restored at L4-5. There is persistent loss of disc height at L2-3 and L3-4. IMPRESSION: 1. Bilateral pedicle screws at L2, L3, L4, and L5 without radiographic evidence for complication. 2. Interbody graft material at L4-5 with restoration of disc height and stable anterolisthesis. Electronically Signed  By: San Morelle M.D.   On: 05/28/2016 14:41    Assessment/Plan: D/C drain.  D/C home.    LOS: 2 days    Peggyann Shoals, MD 05/30/2016, 6:30 AM

## 2016-05-30 NOTE — Progress Notes (Signed)
Occupational Therapy Treatment Patient Details Name: Randy Pena MRN: [id number] DOB: 05-07-1940 Today's Date: 05/30/2016    History of present illness Pt is a 76 y/o male who presents s/p L2-L5 decompression and L3-L5 PLIF on 05/28/16.   OT comments  Pt. Progressing with OT goals.  Able to don brace, complete toilet and tub transfers, and complete grooming tasks.  Wife also available to assist prn.  Note d/c likely later this am.  Follow Up Recommendations  Supervision - Intermittent    Equipment Recommendations  None recommended by OT    Recommendations for Other Services      Precautions / Restrictions Precautions Precautions: Fall;Back Required Braces or Orthoses: Spinal Brace Spinal Brace: Lumbar corset;Applied in sitting position       Mobility Bed Mobility               General bed mobility comments: seated eob beginning and end of session  Transfers Overall transfer level: Needs assistance Equipment used: None Transfers: Sit to/from [facility] Sit to Stand: Supervision Stand pivot transfers: Supervision       General transfer comment: no physical assistance required    Balance                                           ADL either performed or assessed with clinical judgement   ADL Overall ADL's : Needs assistance/impaired               Lower Body Bathing Details (indicate cue type and reason): reports wife will assist as needed Upper Body Dressing : Set up;Sitting Upper Body Dressing Details (indicate cue type and reason): don brace   Lower Body Dressing Details (indicate cue type and reason): no need for A/E, pt. reports wife will assist as needed Toilet Transfer: Supervision/safety;Ambulation   Toileting- Clothing Manipulation and Hygiene: Supervision/safety;Sit to/from stand Toileting - Clothing Manipulation Details (indicate cue type and reason): stated concerns regarding pericare to buttocks after  BM.  able to stand and reach buttocks without twisting but states his wife is also available to help for "thoroughness" prn Tub/ Shower Transfer: Walk-in shower;Supervision/safety;Ambulation   Functional mobility during ADLs: Supervision/safety General ADL Comments: pt. moving well, reports wife available as needed     Vision       Perception     Praxis      Cognition Arousal/Alertness: Awake/alert Behavior During Therapy: WFL for tasks assessed/performed Overall Cognitive Status: Within Functional Limits for tasks assessed                                          Exercises     Shoulder Instructions       General Comments      Pertinent Vitals/ Pain       Pain Assessment: 0-10 Pain Score: 6  Pain Location: incisioin site Pain Descriptors / Indicators: Operative site guarding;Discomfort Pain Intervention(s): Premedicated before session;Monitored during session;Repositioned  Home Living                                          Prior Functioning/Environment              Frequency  Min 2X/week        Progress Toward Goals  OT Goals(current goals can now be found in the care plan section)  Progress towards OT goals: Progressing toward goals     Plan Discharge plan remains appropriate    Co-evaluation                 End of Session    OT Visit Diagnosis: Pain   Activity Tolerance Patient tolerated treatment well   Patient Left with call bell/phone within reach;Other (comment) (seated eob)   Nurse Communication          Time: 5521-7471 OT Time Calculation (min): 18 min  Charges: OT General Charges $OT Visit: 1 Procedure OT Treatments $Self Care/Home Management : 8-22 mins   Janice Coffin, COTA/L 05/30/2016, 8:14 AM

## 2016-06-02 ENCOUNTER — Encounter (HOSPITAL_COMMUNITY): Payer: Self-pay | Admitting: Neurosurgery

## 2016-12-07 DIAGNOSIS — R7301 Impaired fasting glucose: Secondary | ICD-10-CM | POA: Insufficient documentation

## 2016-12-07 DIAGNOSIS — I6523 Occlusion and stenosis of bilateral carotid arteries: Secondary | ICD-10-CM | POA: Insufficient documentation

## 2016-12-07 DIAGNOSIS — E7800 Pure hypercholesterolemia, unspecified: Secondary | ICD-10-CM | POA: Insufficient documentation

## 2016-12-07 DIAGNOSIS — I10 Essential (primary) hypertension: Secondary | ICD-10-CM | POA: Insufficient documentation

## 2017-03-17 ENCOUNTER — Ambulatory Visit (INDEPENDENT_AMBULATORY_CARE_PROVIDER_SITE_OTHER): Payer: Medicare Other

## 2017-03-17 ENCOUNTER — Encounter: Payer: Self-pay | Admitting: Orthopedic Surgery

## 2017-03-17 ENCOUNTER — Ambulatory Visit: Payer: Medicare Other | Admitting: Orthopedic Surgery

## 2017-03-17 VITALS — BP 147/86 | HR 70 | Ht 70.0 in | Wt 212.0 lb

## 2017-03-17 DIAGNOSIS — Z96651 Presence of right artificial knee joint: Secondary | ICD-10-CM

## 2017-03-17 NOTE — Progress Notes (Signed)
Progress Note   Patient ID: AZIAH BROSTROM, male   DOB: May 11, 1940, 77 y.o.   MRN: [id number]  Chief Complaint  Patient presents with  . Post-op Follow-up    right total knee replacement 03/04/12    77 year old male 5 years post right total knee doing well no complaints     Review of Systems  Constitutional: Negative for fever.  Musculoskeletal: Negative for joint pain.   Current Meds  Medication Sig  . aspirin EC 81 MG tablet Take 81 mg by mouth daily.  Marland Kitchen lisinopril (PRINIVIL,ZESTRIL) 20 MG tablet Take 20 mg by mouth daily.    . Multiple Vitamin (MULTIVITAMIN WITH MINERALS) TABS tablet Take 1 tablet by mouth daily.    Allergies  Allergen Reactions  . No Known Allergies      BP (!) 147/86   Pulse 70   Ht 5\' 10"  (1.778 m)   Wt 212 lb (96.2 kg)   BMI 30.42 kg/m   Physical Exam  Constitutional: He is oriented to person, place, and time. He appears well-developed and well-nourished.  Vital signs have been reviewed and are stable. Gen. appearance the patient is well-developed and well-nourished with normal grooming and hygiene.   Musculoskeletal:       Legs: GAIT IS normal  Neurological: He is alert and oriented to person, place, and time.  Skin: Skin is warm and dry. No erythema.  Psychiatric: He has a normal mood and affect.  Vitals reviewed.    Medical decision-making Encounter Diagnosis  Name Primary?  . 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  Repeat x-ray in 1 yr   [name], MD 03/17/2017 8:59 AM

## 2018-03-16 ENCOUNTER — Ambulatory Visit: Payer: Self-pay | Admitting: Orthopedic Surgery

## 2018-03-23 ENCOUNTER — Ambulatory Visit (INDEPENDENT_AMBULATORY_CARE_PROVIDER_SITE_OTHER): Payer: Medicare Other

## 2018-03-23 ENCOUNTER — Ambulatory Visit: Payer: Medicare Other | Admitting: Orthopedic Surgery

## 2018-03-23 ENCOUNTER — Encounter: Payer: Self-pay | Admitting: Orthopedic Surgery

## 2018-03-23 VITALS — BP 126/75 | HR 61 | Ht 70.0 in | Wt 215.0 lb

## 2018-03-23 DIAGNOSIS — Z96651 Presence of right artificial knee joint: Secondary | ICD-10-CM

## 2018-03-23 NOTE — Progress Notes (Signed)
ANNUAL FOLLOW UP FOR  RIGHT  TKA   Chief Complaint  Patient presents with  . Post-op Follow-up    03/04/12 right total knee replacement      HPI: The patient is here for the annual  follow-up x-ray for knee replacement. The patient is not complaining of pain weakness instability or stiffness in the repaired knee.   Review of Systems  Constitutional: Negative for fever.  Skin: Negative for rash.       NO ERYTHEMA    Past Medical History:  Diagnosis Date  . 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 DJD (degenerative joint disease)   . HTN (hypertension)   . 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 Spinal stenosis      Examination of the RIGHT  KNEE  BP 126/75   Pulse 61   Ht 5\' 10"  (1.778 m)   Wt 215 lb (97.5 kg)   BMI 30.85 kg/m   General the patient is normally groomed in no distress  Mood normal Affect pleasant   The patient is Awake and alert ; oriented normal   Inspection shows : incision healed nicely without erythema, no tenderness no swelling  Range of motion total range of motion is 120  Stability the knee is stable anterior to posterior as well as medial to lateral  Strength quadriceps strength is normal  Skin no erythema around the skin incision  Cardiovascular NO EDEMA   Neuro: normal sensation in the operative leg   Gait: normal expected gait without cane    Medical decision-making section  X-rays ordered with the following personal interpretation  Normal alignment without loosening   Diagnosis  Encounter Diagnosis  Name Primary?  . 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

## 2018-11-29 ENCOUNTER — Encounter: Payer: Medicare Other | Admitting: Vascular Surgery

## 2018-12-06 ENCOUNTER — Ambulatory Visit: Payer: Medicare Other | Admitting: Vascular Surgery

## 2018-12-06 ENCOUNTER — Other Ambulatory Visit: Payer: Self-pay

## 2018-12-06 ENCOUNTER — Encounter: Payer: Self-pay | Admitting: Vascular Surgery

## 2018-12-06 VITALS — BP 129/71 | HR 69 | Temp 97.7°F | Resp 20 | Ht 70.0 in | Wt 208.0 lb

## 2018-12-06 DIAGNOSIS — I6522 Occlusion and stenosis of left carotid artery: Secondary | ICD-10-CM

## 2018-12-06 NOTE — Progress Notes (Signed)
Vascular and Vein Specialist of [facility]  Patient name: Randy Pena MRN: [id number] DOB: 22-Jan-1941 Sex: male  REASON FOR CONSULT: Evaluation of asymptomatic carotid disease.  HPI: Randy Pena is a 78 y.o. male, who is here today for discussion of asymptomatic carotid disease.  He has been followed for many years with a known moderate left carotid stenosis.  He underwent recent repeat at [facility] on 10/11/2018 and is here for discussion of this.  He has had episodes of dizziness and lightheadedness which occurs approximately once per month.  He does not have any factors that trigger this.  He specifically denies any focal neurologic deficit.  No aphasia or amaurosis fugax.  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 Carotid artery occlusion   . DJD (degenerative joint disease)   . HTN (hypertension)   . 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 Spinal stenosis     Family History  Problem Relation Age of Onset  . Heart disease Other   . Arthritis Other   . Cancer Other   . Diabetes Other     SOCIAL HISTORY: Social History   Socioeconomic History  . Marital status: Married    Spouse name: Not on file  . Number of children: Not on file  . Years of education: BS/masters  . Highest education level: Not on file  Occupational History  . Occupation: farmer    Employer: RETIRED  Social Needs  . Financial resource strain: Not on file  . Food insecurity    Worry: Not on file    Inability: Not on file  . Transportation needs    Medical: Not on file    Non-medical: Not on file  Tobacco Use  . Smoking status: Never Smoker  . Smokeless tobacco: Never Used  Substance and Sexual Activity  . Alcohol use: Yes    Comment: social, one drink maybe every 2 weeks or so  per patient  . Drug use: No  . Sexual activity: Yes    Birth control/protection: None  Lifestyle  . Physical activity    Days per week: Not on file    Minutes per session: Not on file  . Stress: Not on file  Relationships  . Social Herbalist on phone: Not on file    Gets together: Not on file    Attends religious service: Not on file    Active member of club or organization: Not on file    Attends meetings of clubs or organizations: Not on file    Relationship status: Not on file  . Intimate partner violence    Fear of current or ex partner: Not on file    Emotionally abused: Not on file    Physically abused: Not on file    Forced sexual activity: Not on file  Other Topics Concern  . Not on file  Social History Narrative  . Not on file    Allergies  Allergen Reactions  . No Known Allergies   . Statins     Muscle pain and stiffness    Current Outpatient Medications  Medication Sig Dispense Refill  . aspirin EC 81 MG tablet Take 81 mg by mouth daily.    Marland Kitchen lisinopril (PRINIVIL,ZESTRIL) 20 MG tablet Take 20  mg by mouth daily.      . Multiple Vitamin (MULTIVITAMIN WITH MINERALS) TABS tablet Take 1 tablet by mouth daily.     No current facility-administered medications for this visit.     REVIEW OF SYSTEMS:  [X]  denotes positive finding, [ ]  denotes negative finding Cardiac  Comments:  Chest pain or chest pressure:    Shortness of breath upon exertion: x   Short of breath when lying flat:    Irregular heart rhythm:        Vascular    Pain in calf, thigh, or hip brought on by ambulation:    Pain in feet at night that wakes you up from your sleep:  x   Blood clot in your veins:    Leg swelling:  x       Pulmonary    Oxygen at home:    Productive cough:     Wheezing:         Neurologic    Sudden weakness in arms or legs:     Sudden numbness in arms or legs:     Sudden onset of difficulty speaking or slurred speech:    Temporary loss of vision in  one eye:     Problems with dizziness:  x       Gastrointestinal    Blood in stool:     Vomited blood:         Genitourinary    Burning when urinating:     Blood in urine:        Psychiatric    Major depression:         Hematologic    Bleeding problems:    Problems with blood clotting too easily:        Skin    Rashes or ulcers:        Constitutional    Fever or chills:      PHYSICAL EXAM: Vitals:   12/06/18 0940 12/06/18 0943  BP: 139/77 129/71  Pulse: 69   Resp: 20   Temp: 97.7 F (36.5 C)   SpO2: 97%   Weight: 208 lb (94.3 kg)   Height: 5\' 10"  (1.778 m)     GENERAL: The patient is a well-nourished male, in no acute distress. The vital signs are documented above. CARDIOVASCULAR: Carotid arteries are without bruits bilaterally.  2+ radial pulses bilaterally.  2+ femoral pulses.  He does have very prominent popliteal pulses bilaterally and 2+ dorsalis pedis pulses PULMONARY: There is good air exchange  ABDOMEN: Soft and non-tender  MUSCULOSKELETAL: There are no major deformities or cyanosis. NEUROLOGIC: No focal weakness or paresthesias are detected. SKIN: There are no ulcers or rashes noted. PSYCHIATRIC: The patient has a normal affect.  DATA:  Noninvasive carotid study from 10/11/2018 was reviewed showing 50 to 69% left carotid stenosis and no significant stenosis on the right  MEDICAL ISSUES: I have recommended continued yearly ultrasound.  This has been stable for quite some time.  Explained that we would recommend intervention for greater than 80% stenosis with carotid surgery.  He can have the carotid duplex in [place] for his convenience and see [place] for progression  Also discussed is a very prominent popliteal pulses.  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  Have recommended ultrasound of his aorta and popliteal arteries for further evaluation.  We will coordinate this at [facility] in [facility] for his convenience and will follow up with  him following these studies   Rosetta Posner, MD [facility] Vascular and Vein Specialists of [facility] Tel [phone] Pager [phone]

## 2018-12-11 DIAGNOSIS — E041 Nontoxic single thyroid nodule: Secondary | ICD-10-CM | POA: Insufficient documentation

## 2018-12-22 ENCOUNTER — Other Ambulatory Visit (HOSPITAL_COMMUNITY): Payer: Self-pay | Admitting: Otolaryngology

## 2018-12-22 ENCOUNTER — Ambulatory Visit (INDEPENDENT_AMBULATORY_CARE_PROVIDER_SITE_OTHER): Payer: Medicare Other | Admitting: Otolaryngology

## 2018-12-22 ENCOUNTER — Other Ambulatory Visit: Payer: Self-pay

## 2018-12-22 DIAGNOSIS — E041 Nontoxic single thyroid nodule: Secondary | ICD-10-CM

## 2018-12-23 ENCOUNTER — Encounter (HOSPITAL_COMMUNITY): Payer: Self-pay

## 2018-12-23 NOTE — Progress Notes (Signed)
Randy Pena Male, 78 y.o., Feb 04, 1941 MRN:  [id number] Phone:  [phone] (H) ... PCP:  Randy Mealy, MD Coverage:  [facility] 03/22/2019 at 9:30 AM  RE: Biospy Received: Today Message Contents  Randy Mckusick, DO  Randy Pena  OK for US guided FNA of the inferior right thyroid lobe nodule, 5.4cm.   Randy Pena   Previous Messages  ----- Message -----  From: Randy Pena  Sent: 12/23/2018  1:07 PM EST  To: Ir Procedure Requests  Subject: Biospy                      Procedure Requested: [place] FNA Biopsy    Reason for Procedure: Thyroid Nodule    Provider Requesting: Leta Baptist, MD  Provider Telephone: [phone]    Other Info: [facility] [email or web address]   Accession number [id number] Franquez US soft tissue head/neck 11/08/2018

## 2018-12-28 ENCOUNTER — Encounter (HOSPITAL_COMMUNITY): Payer: Self-pay

## 2018-12-28 ENCOUNTER — Other Ambulatory Visit: Payer: Self-pay

## 2018-12-28 ENCOUNTER — Ambulatory Visit (HOSPITAL_COMMUNITY)
Admission: RE | Admit: 2018-12-28 | Discharge: 2018-12-28 | Disposition: A | Discharge status: Home / Self Care | Payer: Medicare Other | Source: Ambulatory Visit | Attending: Otolaryngology | Admitting: Otolaryngology

## 2018-12-28 DIAGNOSIS — E041 Nontoxic single thyroid nodule: Secondary | ICD-10-CM | POA: Diagnosis not present

## 2018-12-28 MED ORDER — LIDOCAINE HCL (PF) 2 % IJ SOLN
INTRAMUSCULAR | Status: AC
  Filled 2018-12-28: qty 10

## 2018-12-28 NOTE — Procedures (Signed)
PreOperative Dx: RT thyroid nodule Postoperative Dx: RT thyroid nodule Procedure:   US guided FNA of RT thyroid nodule Radiologist:  Thornton Papas Anesthesia:  3 ml of 2% lidocaine Specimen:  FNA x 5 (3 cytology, 2 Afirma)  EBL:   < 1 ml Complications: None

## 2018-12-29 LAB — CYTOLOGY - NON PAP

## 2019-01-04 ENCOUNTER — Telehealth: Payer: Self-pay | Admitting: Vascular Surgery

## 2019-01-04 NOTE — Telephone Encounter (Signed)
Spoke with the patient by telephone today.  I had seen him in our office on 12/06/2018 for asymptomatic carotid disease.  On physical exam he was found to have very prominent popliteal pulses and I was concerned about the potential for popliteal artery aneurysm and then consequently stable aortic aneurysm.  He went to [facility] at his home in [place] for the studies.  Unfortunately the wrong study was done on his lower extremities.  He had ankle arm indices rather than duplex.  His ultrasound suggested maximal diameter of 3.7 but was felt that this was an overestimate due to tangential tortuosity measurement.  I suspect that his maximal diameter is in the 3.1 range.  He did have normal ankle arm index which was anticipated since he has normal pulses to his feet  We will coordinate for him to have the appropriate study with lower extremity duplex.  Will ask the radiology department to not charge him for the ankle arm indices it was not ordered.  We will touch base with him once we have the popliteal duplex results

## 2019-01-25 ENCOUNTER — Telehealth (HOSPITAL_COMMUNITY): Payer: Self-pay | Admitting: *Deleted

## 2019-01-25 ENCOUNTER — Other Ambulatory Visit: Payer: Self-pay

## 2019-01-25 DIAGNOSIS — Z136 Encounter for screening for cardiovascular disorders: Secondary | ICD-10-CM

## 2019-01-25 NOTE — Telephone Encounter (Signed)

## 2019-01-26 ENCOUNTER — Other Ambulatory Visit: Payer: Self-pay

## 2019-01-26 ENCOUNTER — Ambulatory Visit (HOSPITAL_COMMUNITY)
Admission: RE | Admit: 2019-01-26 | Discharge: 2019-01-26 | Disposition: A | Discharge status: Home / Self Care | Payer: Medicare Other | Source: Ambulatory Visit | Attending: Family | Admitting: Family

## 2019-01-26 ENCOUNTER — Ambulatory Visit (INDEPENDENT_AMBULATORY_CARE_PROVIDER_SITE_OTHER)
Admission: RE | Admit: 2019-01-26 | Discharge: 2019-01-26 | Disposition: A | Discharge status: Home / Self Care | Payer: Medicare Other | Source: Ambulatory Visit | Attending: Family | Admitting: Family

## 2019-01-26 DIAGNOSIS — Z136 Encounter for screening for cardiovascular disorders: Secondary | ICD-10-CM | POA: Insufficient documentation

## 2019-02-14 ENCOUNTER — Telehealth: Payer: Self-pay | Admitting: Vascular Surgery

## 2019-02-14 NOTE — Telephone Encounter (Signed)
I called the patient today to discuss noninvasive studies in our office on 01/26/1999.  He had been seen in our office in October.  On physical exam was found to have prominent popliteal pulses and recommended that he undergo ultrasound.  This initially had been scheduled at [facility] but was not possible to be done at that facility.  He subsequently underwent duplex in our office.  His popliteal was relatively large bilaterally but not aneurysmal.  Aortic size was normal.  He was reassured with the studies.  We will keep his office visit as scheduled with a carotid duplex in October 2021 for follow-up of his moderate carotid disease

## 2019-03-22 ENCOUNTER — Ambulatory Visit: Payer: Medicare PPO | Admitting: Orthopedic Surgery

## 2019-03-22 ENCOUNTER — Other Ambulatory Visit: Payer: Self-pay

## 2019-03-22 ENCOUNTER — Ambulatory Visit: Payer: Medicare PPO

## 2019-03-22 VITALS — BP 105/61 | HR 69 | Temp 97.2°F | Ht 70.0 in | Wt 202.0 lb

## 2019-03-22 DIAGNOSIS — M1711 Unilateral primary osteoarthritis, right knee: Secondary | ICD-10-CM

## 2019-03-22 DIAGNOSIS — Z96651 Presence of right artificial knee joint: Secondary | ICD-10-CM

## 2019-03-22 DIAGNOSIS — M1710 Unilateral primary osteoarthritis, unspecified knee: Secondary | ICD-10-CM

## 2019-03-22 NOTE — Progress Notes (Signed)
Chief Complaint  Patient presents with  . Follow-up    Recheck on right knee replacement, DOS 03-04-12.   6 years postop right total knee doing well no issues  79 year old male has had no problems with his knee he is having some pain on his left knee but does not want to do anything about that at this time  His range of motion is 0-1 20  His x-ray shows 4 degrees of tilt on his tibia no loosening  Follow-up in a year

## 2020-03-15 IMAGING — US US FNA BIOPSY THYROID 1ST LESION
1 series · 13 of 15 positions shown · non-contrast
Comparison: Thyroid ultrasound 11/08/2018

MEDICATIONS:
None

COMPLICATIONS:
None immediate.

INDICATION: Indeterminate thyroid nodule RIGHT thyroid lobe

EXAM:
ULTRASOUND GUIDED FINE NEEDLE ASPIRATION OF INDETERMINATE THYROID
NODULE
TECHNIQUE: Informed written consent was obtained from the patient after a
discussion of the risks, benefits and alternatives to treatment.
Questions regarding the procedure were encouraged and answered. A
timeout was performed prior to the initiation of the procedure.

[Series 1: us fna biopsy thyroid 1st lesion · 15 acquisitions, 13 frames shown]
[im 1/15]
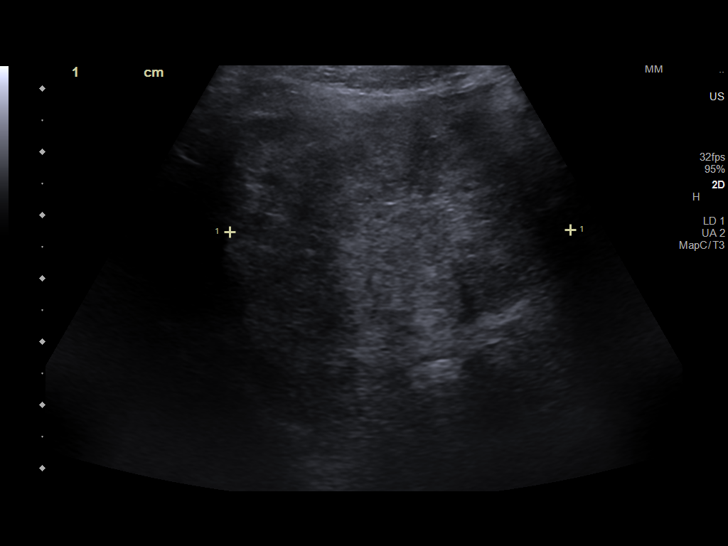
[im 2/15]
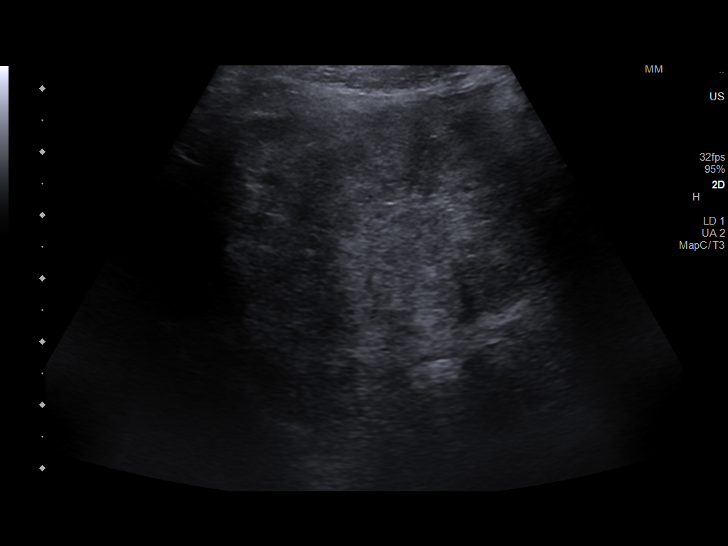
[im 3/15]
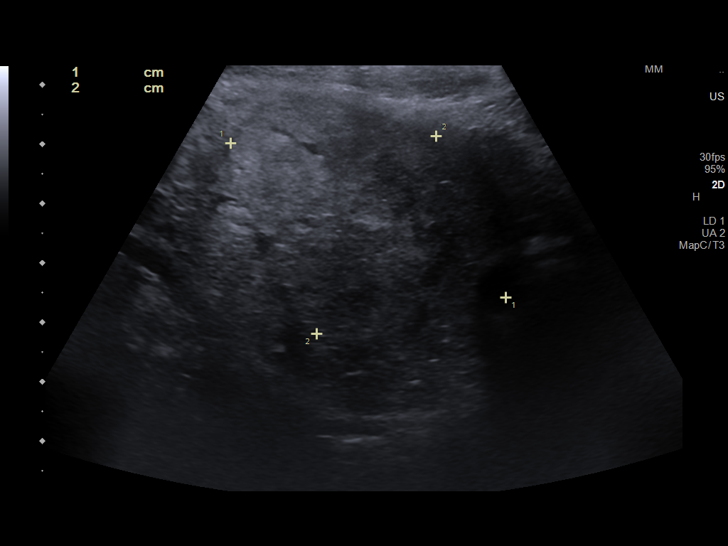
[im 5/15]
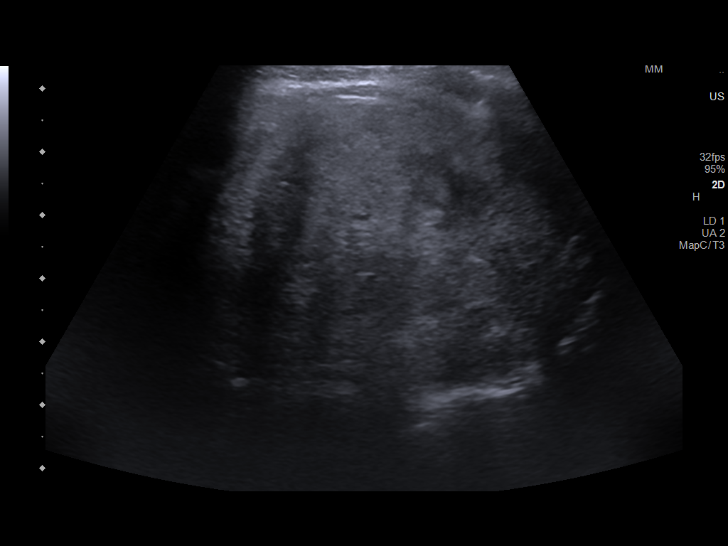
[im 6/15]
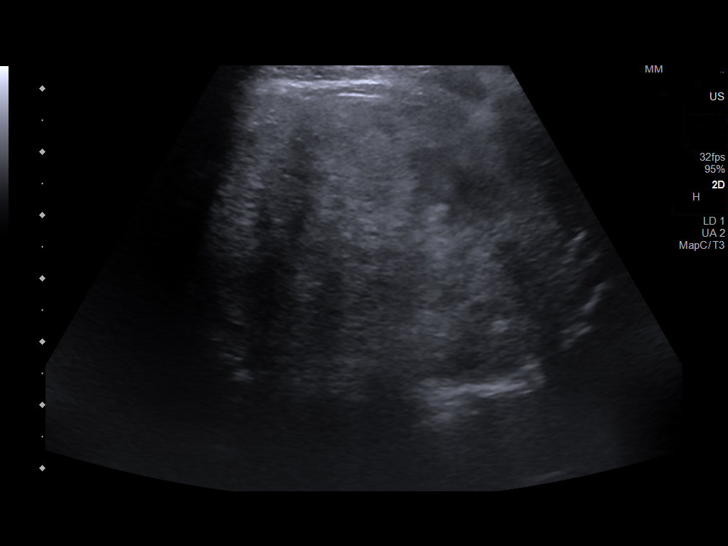
[im 7/15]
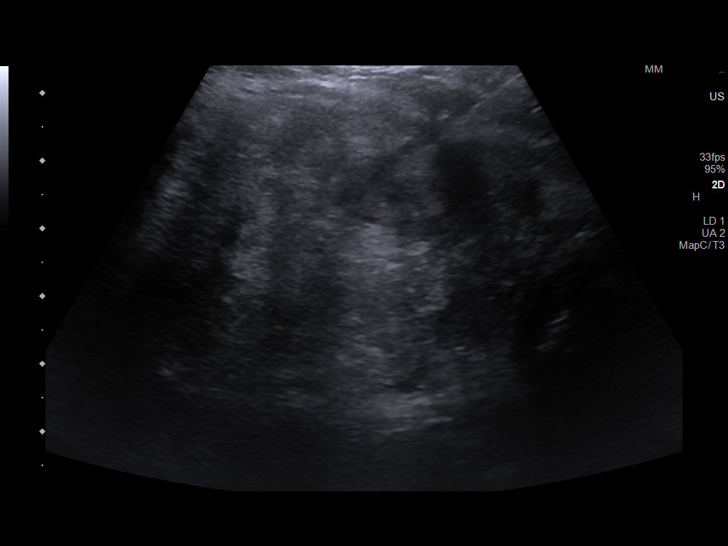
[im 8/15]
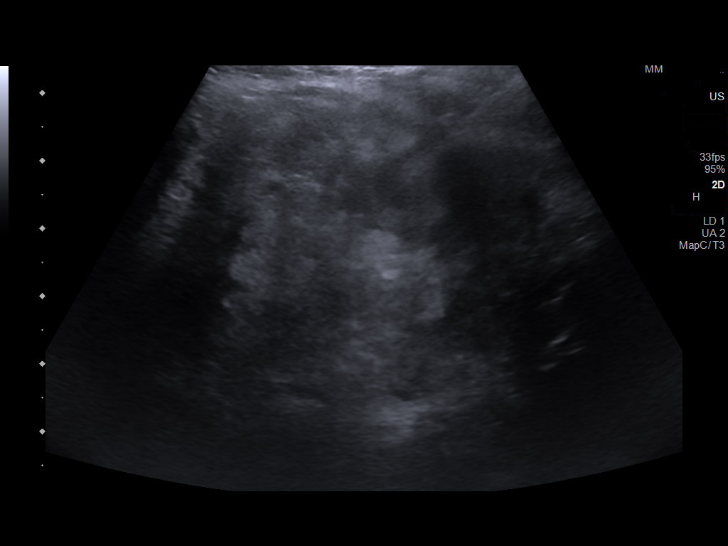
[im 9/15]
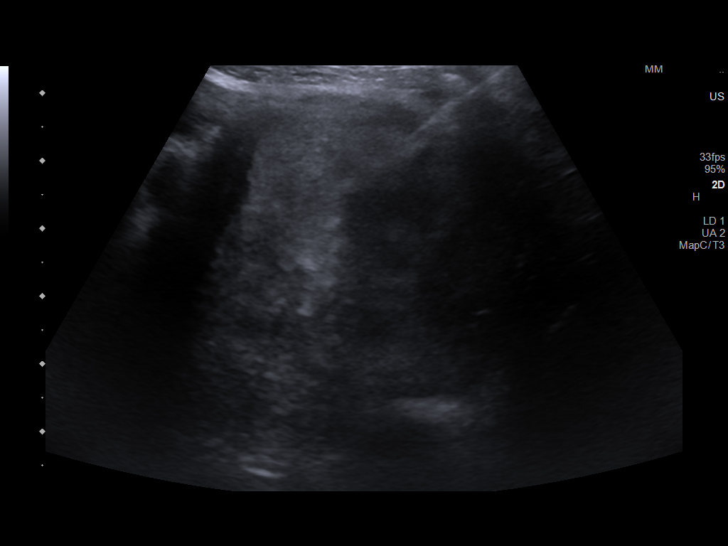
[im 10/15]
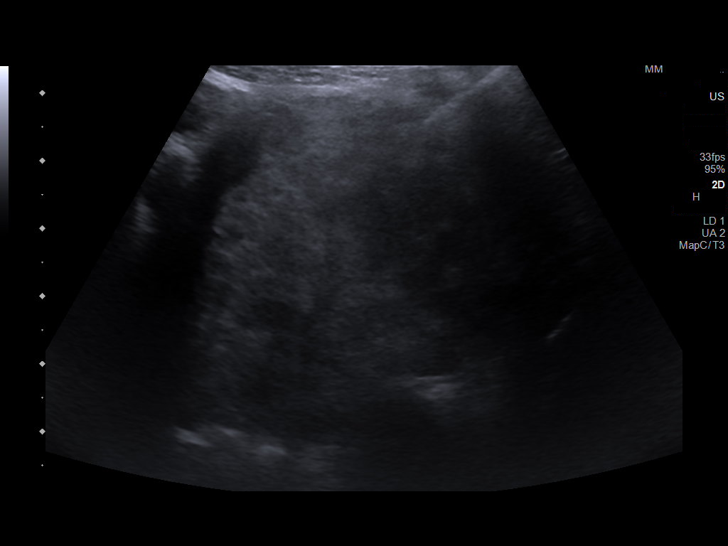
[im 11/15]
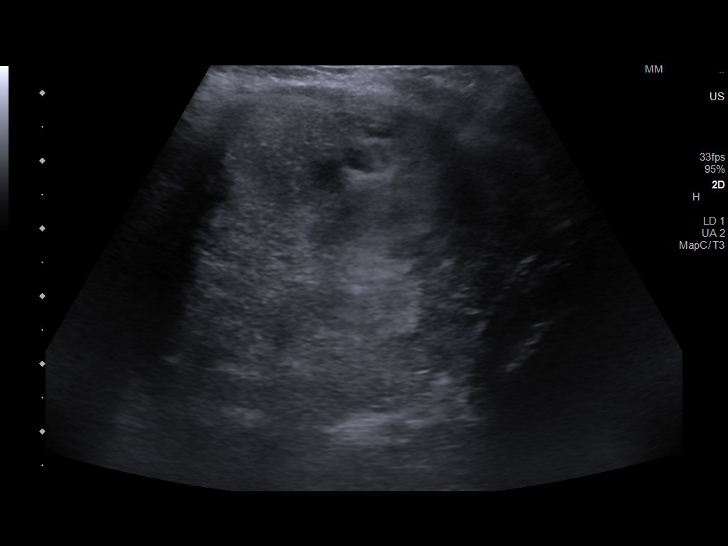
[im 13/15]
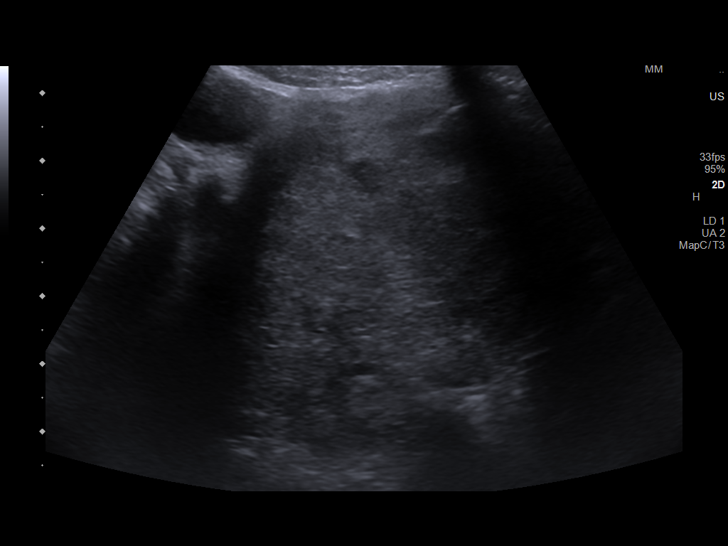
[im 14/15]
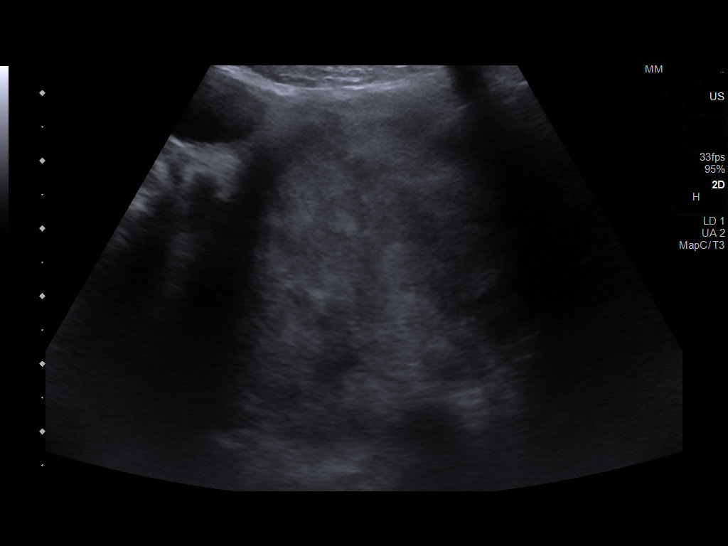
[im 15/15]
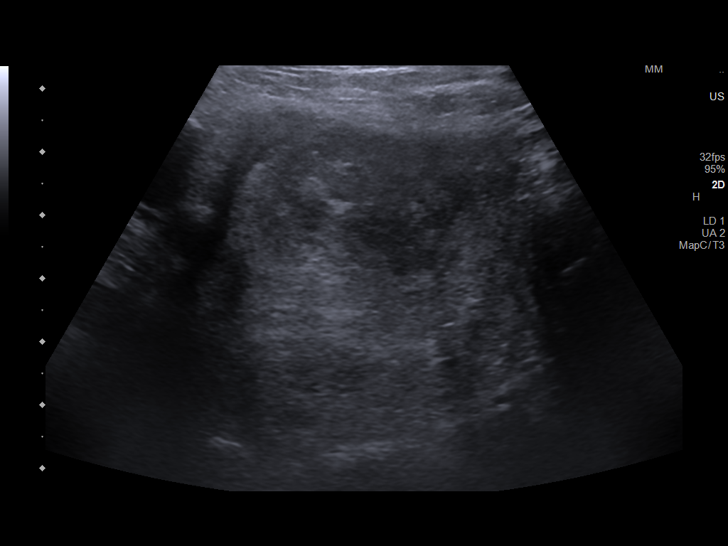

[13 of 15 positions shown; findings below may reference images not displayed]

Pre-procedural ultrasound scanning demonstrated unchanged size and
appearance of the indeterminate nodule within the RIGHT thyroid lobe

The procedure was planned. The neck was prepped in the usual sterile
fashion, and a sterile drape was applied covering the operative
field. A timeout was performed prior to the initiation of the
procedure. Local anesthesia was provided with 1% lidocaine.

Under direct ultrasound guidance, 3 FNA biopsies were performed of
the RIGHT thyroid mass with a 25 gauge needle. Multiple ultrasound
images were saved for procedural documentation purposes. The samples
were prepared and submitted to pathology. Two additional FNA samples
were obtained for Afirma testing, if required.

Limited post procedural scanning was negative for hematoma or
additional complication. Dressings were placed. The patient
tolerated the above procedures procedure well without immediate
postprocedural complication.
FINDINGS: Nodule reference number based on prior diagnostic ultrasound: #1

Maximum size: 5.4 cm

Location: Right; Inferior

ACR TI-RADS risk category: TR3 (3 points)

Reason for biopsy: meets ACR TI-RADS criteria

Ultrasound imaging confirms appropriate placement of the needles
within the thyroid nodule.
IMPRESSION: Technically successful ultrasound guided fine needle aspiration of
inferior RIGHT thyroid nodule.

## 2020-03-21 ENCOUNTER — Encounter: Payer: Self-pay | Admitting: Orthopedic Surgery

## 2020-03-21 ENCOUNTER — Ambulatory Visit: Payer: Medicare PPO

## 2020-03-21 ENCOUNTER — Ambulatory Visit (INDEPENDENT_AMBULATORY_CARE_PROVIDER_SITE_OTHER): Payer: Medicare PPO | Admitting: Orthopedic Surgery

## 2020-03-21 ENCOUNTER — Other Ambulatory Visit: Payer: Self-pay

## 2020-03-21 VITALS — BP 142/63 | HR 56 | Ht 70.0 in | Wt 198.0 lb

## 2020-03-21 DIAGNOSIS — M1711 Unilateral primary osteoarthritis, right knee: Secondary | ICD-10-CM

## 2020-03-21 DIAGNOSIS — Z96651 Presence of right artificial knee joint: Secondary | ICD-10-CM

## 2020-03-21 DIAGNOSIS — M1990 Unspecified osteoarthritis, unspecified site: Secondary | ICD-10-CM | POA: Insufficient documentation

## 2020-03-21 NOTE — Progress Notes (Addendum)
ANNUAL FOLLOW UP FOR  Right  TKA   Chief Complaint  Patient presents with  . Post-op Follow-up    Right 03/04/12      HPI: The patient is here for the annual  follow-up x-ray for knee replacement. The patient is not complaining of pain weakness instability or stiffness in the repaired knee.   Review of Systems  Musculoskeletal: Positive for back pain.    Examination of the right  KNEE  BP (!) 142/63   Pulse (!) 56   Ht 5\' 10"  (1.778 m)   Wt 198 lb (89.8 kg)   BMI 28.41 kg/m   General the patient is normally groomed in no distress  Inspection shows : incision healed nicely without erythema, no tenderness no swelling  Range of motion total range of motion is 125  Stability the knee is stable anterior to posterior as well as medial to lateral  Strength quadriceps strength is normal  Skin no erythema around the skin incision  Neuro: normal sensation in the operative leg   Gait: normal expected gait without cane    Medical decision-making section  X-rays ordered, internal imaging shows (see full dictated report) stable implant with no signs of loosening  Diagnosis  Encounter Diagnoses  Name Primary?  . 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 Primary osteoarthritis of right knee      Plan patient is doing well at 8 years no signs of loosening or wear opted for as needed follow-up

## 2020-03-22 ENCOUNTER — Ambulatory Visit: Payer: Medicare PPO | Admitting: Orthopedic Surgery

## 2020-04-04 ENCOUNTER — Encounter (INDEPENDENT_AMBULATORY_CARE_PROVIDER_SITE_OTHER): Payer: Self-pay | Admitting: *Deleted

## 2020-08-22 ENCOUNTER — Other Ambulatory Visit (HOSPITAL_COMMUNITY): Payer: Self-pay | Admitting: Otolaryngology

## 2020-08-22 DIAGNOSIS — E041 Nontoxic single thyroid nodule: Secondary | ICD-10-CM

## 2020-08-28 ENCOUNTER — Other Ambulatory Visit: Payer: Self-pay

## 2020-08-28 ENCOUNTER — Ambulatory Visit (HOSPITAL_COMMUNITY)
Admission: RE | Admit: 2020-08-28 | Discharge: 2020-08-28 | Disposition: A | Discharge status: Home / Self Care | Payer: Medicare PPO | Source: Ambulatory Visit | Attending: Otolaryngology | Admitting: Otolaryngology

## 2020-08-28 DIAGNOSIS — E041 Nontoxic single thyroid nodule: Secondary | ICD-10-CM | POA: Diagnosis not present

## 2020-09-01 ENCOUNTER — Encounter (HOSPITAL_COMMUNITY): Payer: Self-pay | Admitting: *Deleted

## 2020-09-01 ENCOUNTER — Other Ambulatory Visit: Payer: Self-pay

## 2020-09-01 ENCOUNTER — Emergency Department (HOSPITAL_COMMUNITY)
Admission: EM | Admit: 2020-09-01 | Discharge: 2020-09-01 | Disposition: A | Discharge status: Home / Self Care | Payer: Medicare PPO | Attending: Emergency Medicine | Admitting: Emergency Medicine

## 2020-09-01 ENCOUNTER — Emergency Department (HOSPITAL_COMMUNITY): Payer: Medicare PPO

## 2020-09-01 DIAGNOSIS — Z7982 Long term (current) use of aspirin: Secondary | ICD-10-CM | POA: Insufficient documentation

## 2020-09-01 DIAGNOSIS — R42 Dizziness and giddiness: Secondary | ICD-10-CM | POA: Diagnosis not present

## 2020-09-01 DIAGNOSIS — R0602 Shortness of breath: Secondary | ICD-10-CM | POA: Diagnosis not present

## 2020-09-01 DIAGNOSIS — Z96651 Presence of right artificial knee joint: Secondary | ICD-10-CM | POA: Diagnosis not present

## 2020-09-01 DIAGNOSIS — Z79899 Other long term (current) drug therapy: Secondary | ICD-10-CM | POA: Diagnosis not present

## 2020-09-01 DIAGNOSIS — R0789 Other chest pain: Secondary | ICD-10-CM

## 2020-09-01 DIAGNOSIS — I10 Essential (primary) hypertension: Secondary | ICD-10-CM | POA: Diagnosis not present

## 2020-09-01 DIAGNOSIS — Z8529 Personal history of malignant neoplasm of other respiratory and intrathoracic organs: Secondary | ICD-10-CM | POA: Insufficient documentation

## 2020-09-01 LAB — CBC
HCT: 26.2 % — ABNORMAL LOW (ref 39.0–52.0)
Hemoglobin: 9.7 g/dL — ABNORMAL LOW (ref 13.0–17.0)
MCH: 42.5 pg — ABNORMAL HIGH (ref 26.0–34.0)
MCHC: 36.2 g/dL — ABNORMAL HIGH (ref 30.0–36.0)
MCV: 118 fL — ABNORMAL HIGH (ref 80.0–100.0)
Platelets: 238 10*3/uL (ref 150–400)
RBC: 2.22 MIL/uL — ABNORMAL LOW (ref 4.22–5.81)
RDW: 14.7 % (ref 11.5–15.5)
WBC: 5.1 10*3/uL (ref 4.0–10.5)
nRBC: 0 % (ref 0.0–0.2)

## 2020-09-01 LAB — BASIC METABOLIC PANEL
Anion gap: 4 — ABNORMAL LOW (ref 5–15)
BUN: 26 mg/dL — ABNORMAL HIGH (ref 8–23)
CO2: 22 mmol/L (ref 22–32)
Calcium: 8.5 mg/dL — ABNORMAL LOW (ref 8.9–10.3)
Chloride: 107 mmol/L (ref 98–111)
Creatinine, Ser: 1.04 mg/dL (ref 0.61–1.24)
GFR, Estimated: >60 mL/min (ref >60)
Glucose, Bld: 141 mg/dL — ABNORMAL HIGH (ref 70–99)
Potassium: 4.3 mmol/L (ref 3.5–5.1)
Sodium: 133 mmol/L — ABNORMAL LOW (ref 135–145)

## 2020-09-01 LAB — TROPONIN I (HIGH SENSITIVITY)
Troponin I (High Sensitivity): 6 ng/L (ref <18)
Troponin I (High Sensitivity): 6 ng/L (ref <18)

## 2020-09-01 LAB — D-DIMER, QUANTITATIVE: D-Dimer, Quant: 0.42 ug/mL-FEU (ref 0.00–0.50)

## 2020-09-01 LAB — POC OCCULT BLOOD, ED: Fecal Occult Bld: NEGATIVE

## 2020-09-01 LAB — CBG MONITORING, ED: Glucose-Capillary: 112 mg/dL — ABNORMAL HIGH (ref 70–99)

## 2020-09-01 MED ORDER — SODIUM CHLORIDE 0.9 % IV BOLUS
1000.0000 mL | Freq: Once | INTRAVENOUS | Status: AC
  Administered 2020-09-01: 1000 mL via INTRAVENOUS

## 2020-09-01 MED ORDER — ASPIRIN 81 MG PO CHEW
324.0000 mg | CHEWABLE_TABLET | Freq: Once | ORAL | Status: AC
  Administered 2020-09-01: 324 mg via ORAL
  Filled 2020-09-01: qty 4

## 2020-09-01 NOTE — Discharge Instructions (Addendum)
Lab work revealed that you have a low hemoglobin, I would like you to follow-up with your primary care provider for further evaluation of this.  All other lab work and imaging were unremarkable, I like to follow-up with your cardiologist for further evaluation  Come back to the emergency department if you develop chest pain, shortness of breath, severe abdominal pain, uncontrolled nausea, vomiting, diarrhea.

## 2020-09-01 NOTE — ED Triage Notes (Signed)
Pt states he woke up this am "not feeling well" pt c/o some chest tightness and dizziness

## 2020-09-01 NOTE — ED Provider Notes (Addendum)
[facility] EMERGENCY DEPARTMENT Provider Note   CSN: [id number] Arrival date & time: 09/01/20  1503     History Chief Complaint  Patient presents with   Chest Pain    Randy Pena is a 80 y.o. male with a history of carotid artery disease under the watch of Dr. Donnetta Hutching, hypertension, hypercholesterolemia presenting for evaluation of chest pain and shortness of breath.  He describes a subtle pressure in his mid chest region which is magnified with attempts at deep inspiration along with some subtle shortness of breath which started this morning shortly after waking.  There is no radiation of pain into his shoulders, but he had noted some discomfort in his jaw while eating a cookie around lunchtime today.  His symptoms are not exertional, in fact he states he feels better when he is ambulatory.  He has had no nausea or vomiting, diaphoresis, palpitations, peripheral edema, also denies abdominal pain.  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  He denies having any chest pain or shortness of breath yesterday.  He has noticed in recent weeks having reduced exercise tolerance however.  He is currently symptom-free.  Of note, he also mentions having some transient lightheadedness which has been recurring over the past several months and had similar symptoms this morning as well.  He has seen his primary provider for this complaint.  The history is provided by the patient.   HPI: A 80 year old patient with a history of peripheral artery disease, hypertension and hypercholesterolemia presents for evaluation of chest pain. Initial onset of pain was more than 6 hours ago. The patient's chest pain is described as heaviness/pressure/tightness and is not worse with exertion. The patient's chest pain is middle- or left-sided, is not well-localized, is not sharp and does not radiate to the arms/jaw/neck. The patient does not complain of nausea and  denies diaphoresis. The patient has no history of stroke, has not smoked in the past 90 days, denies any history of treated diabetes, has no relevant family history of coronary artery disease (first degree relative at less than age 20) and does not have an elevated BMI (>=30).   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   S/P knee replacement 03/09/2013   S/P total knee replacement, right 03/04/12 04/14/2012   Osteoarthritis of right knee 02/16/2012   Osteoarthritis of left knee 09/17/2010   SHOULDER PAIN 10/04/2008   IMPINGEMENT SYNDROME 10/04/2008    Past Surgical History:  Procedure Laterality Date   CHEST WALL BIOPSY     melanoma-[facility]   EYE SURGERY     Laser surgery   JOINT REPLACEMENT     total right knee replacement today   TOTAL KNEE ARTHROPLASTY  03/07/2012   Procedure: TOTAL KNEE ARTHROPLASTY;  Surgeon: Carole Civil, MD;  Location: [facility];  Service: Orthopedics;  Laterality: Right;  depuy       Family History  Problem  Relation Age of Onset   Heart disease Other    Arthritis Other    Cancer Other    Diabetes Other     Social History   Tobacco Use   Smoking status: Never   Smokeless tobacco: Never  Vaping Use   Vaping Use: Never used  Substance Use Topics   Alcohol use: Yes    Comment: social, one drink maybe every 2 weeks or so per patient   Drug use: No    Home Medications Prior to Admission medications   Medication Sig Start Date End Date Taking?  Authorizing Provider  acetaminophen (TYLENOL) 500 MG tablet Take 500 mg by mouth every 6 (six) hours as needed.   Yes [provider]  atorvastatin (LIPITOR) 10 MG tablet Take 1 tablet by mouth every evening. 01/10/20  Yes [provider]  lisinopril (PRINIVIL,ZESTRIL) 20 MG tablet Take 20 mg by mouth daily.     Yes [provider]  Multiple Vitamin (MULTIVITAMIN WITH MINERALS) TABS tablet Take 1 tablet by mouth daily.   Yes [provider]  Pseudoephedrine-Ibuprofen (ADVIL COLD/SINUS PO) Take 1 tablet by mouth daily as needed.   Yes [provider]  aspirin EC 81 MG tablet Take 81 mg by mouth daily. Patient not taking: Reported on 09/01/2020    [provider]  lisinopril (ZESTRIL) 20 MG tablet Take 1 tablet by mouth daily. Patient not taking: Reported on 09/01/2020 06/13/20   [provider]    Allergies    No known allergies and Statins  Review of Systems   Review of Systems  Constitutional:  Negative for chills, diaphoresis and fever.  HENT:  Negative for congestion and sore throat.   Eyes: Negative.   Respiratory:  Positive for shortness of breath. Negative for cough and chest tightness.   Cardiovascular:  Negative for chest pain, palpitations and leg swelling.  Gastrointestinal:  Negative for abdominal pain, nausea and vomiting.  Genitourinary: Negative.   Musculoskeletal:  Negative for arthralgias, joint swelling and neck pain.  Skin: Negative.  Negative for rash and wound.  Neurological:  Positive for light-headedness. Negative for dizziness, weakness, numbness and headaches.  Psychiatric/Behavioral: Negative.     Physical Exam Updated Vital Signs BP (!) 86/59   Pulse (!) 57   Temp 97.9 F (36.6 C) (Oral)   Resp (!) 21   Ht '5\' 9"'$  (1.753 m)   Wt 84.8 kg   SpO2 99%   BMI 27.62 kg/m   Physical Exam Vitals and nursing note reviewed.  Constitutional:      Appearance: He is well-developed.  HENT:     Head:  Normocephalic and atraumatic.  Eyes:     Conjunctiva/sclera: Conjunctivae normal.  Cardiovascular:     Rate and Rhythm: Normal rate and regular rhythm.     Pulses:          Dorsalis pedis pulses are 2+ on the right side and 2+ on the left side.     Heart sounds: Normal heart sounds.  Pulmonary:     Effort: Pulmonary effort is normal.     Breath sounds: Normal breath sounds. No decreased breath sounds, wheezing or rhonchi.  Chest:     Chest wall: No tenderness.  Abdominal:     General: Bowel sounds are normal.     Palpations: Abdomen is soft.     Tenderness: There is no abdominal tenderness.  Musculoskeletal:        General: Normal range of motion.     Cervical  back: Normal range of motion.     Right lower leg: No edema.     Left lower leg: No edema.  Skin:    General: Skin is warm and dry.  Neurological:     Mental Status: He is alert.    ED Results / Procedures / Treatments   Labs (all labs ordered are listed, but only abnormal results are displayed) Labs Reviewed  BASIC METABOLIC PANEL - Abnormal; Notable for the following components:      Result Value   Sodium 133 (*)    Glucose, Bld 141 (*)    BUN 26 (*)    Calcium 8.5 (*)    Anion gap 4 (*)    All other components within normal limits  CBC - Abnormal; Notable for the following components:   RBC 2.22 (*)    Hemoglobin 9.7 (*)    HCT 26.2 (*)    MCV 118.0 (*)    MCH 42.5 (*)    MCHC 36.2 (*)    All other components within normal limits  CBG MONITORING, ED - Abnormal; Notable for the following components:   Glucose-Capillary 112 (*)    All other components within normal limits  D-DIMER, QUANTITATIVE  POC OCCULT BLOOD, ED  TROPONIN I (HIGH SENSITIVITY)  TROPONIN I (HIGH SENSITIVITY)    EKG EKG Interpretation  Date/Time:  Sunday September 01 2020 15:19:50 EDT Ventricular Rate:  65 PR Interval:  195 QRS Duration: 104 QT Interval:  402 QTC Calculation: 418 R Axis:   40 Text Interpretation: Sinus rhythm  Abnormal R-wave progression, early transition Confirmed by Milton Ferguson [id number]) on 09/01/2020 7:15:20 PM  Radiology DG Chest Portable 1 View  Result Date: 09/01/2020 CLINICAL DATA:  Chest pain and shortness of breath starting upon awakening this morning. History of hypertension. EXAM: PORTABLE CHEST 1 VIEW COMPARISON:  Overlapping portion of CT abdomen from 02/24/2018 FINDINGS: Apical lordotic projection of the chest noted. Atherosclerotic calcification of the aortic arch. Thoracic spondylosis. Heart size within normal limits for projection. 5 by 3 mm calcific density at the right lung base is likely a granuloma. IMPRESSION: 1. No specific cause for the patient's symptoms is identified. 2.  Aortic Atherosclerosis (ICD10-I70.0). 3. Thoracic spondylosis. 4. Suspected small calcified granuloma at the right lung base. Electronically Signed   By: Van Clines M.D.   On: 09/01/2020 17:43    Procedures Procedures   Medications Ordered in ED Medications  sodium chloride 0.9 % bolus 1,000 mL (has no administration in time range)  aspirin chewable tablet 324 mg (324 mg Oral Given 09/01/20 1727)    ED Course  I have reviewed the triage vital signs and the nursing notes.  Pertinent labs & imaging results that were available during my care of the patient were reviewed by me and considered in my medical decision making (see chart for details).    MDM Rules/Calculators/A&P HEAR Score: 5                         Patient with chest pain and shortness of breath starting today which is currently resolved.  His EKG and first troponin are stable, pending delta troponin at this time.  Additionally we are waiting for a D-dimer to rule out PE.  He does not have any peripheral edema or calf pain or tenderness, patient is in active individual not sedentary, low risk for PE.  It is noted that he has a hemoglobin of 9.7.  This is a  macrocytic pattern.  His last hemoglobin from outside records - hgb 11.2 02/15/20.  Hemoccult pending.  Discussed with Will Ileene Patrick PA-C who assumes care.     Final Clinical Impression(s) / ED Diagnoses Final diagnoses:  None    Rx / DC Orders ED Discharge Orders     None        Landis Martins 09/01/20 1915    Evalee Jefferson, PA-C 09/01/20 1916    Milton Ferguson, MD 09/02/20 1028

## 2020-09-01 NOTE — ED Provider Notes (Signed)
Patient was received at shift change from [facility] she provided HPI, current work-up, likely disposition please see her note for full detail.  In short patient with significant medical history of CAD, hypertension, hyperlipidemia, presents with chief complaint of shortness of breath and left-sided chest pain, came on suddenly, not worsened with exertion, he not become nauseous, no vomiting, no diaphoresis, no palpitations, no peripheral edema.  He currently has no chest pain at this time.  Work-up reveals macrocytic anemia with a hemoglobin of 9.7, BMP shows hyponatremia 133, hyperglycemia 141, BUN 26, first troponin is 6.  Chest x-ray negative for acute findings, EKG sinus without signs of ischemia  Patient was noted to be slightly hypotensive, fluid was initiated, blood pressure had improved, he was also noted  slight decrease in his hemoglobin in January was 11 and it dropped down to 9.7, he had negative Hemoccults.  Suspect this is secondary due to chronic diseases.  Per previous provider follow-up on D-dimer, second troponin if patient safe for discharge follow-up with cardiology  D-dimer unremarkable, patient a negative delta troponin, patient is reassessed has no complaints this time, vital signs remained stable, patient is agreeable for discharge.  Come back to the emergency department if you develop chest pain, shortness of breath, severe abdominal pain, uncontrolled nausea, vomiting, diarrhea.    Marcello Fennel, PA-C 09/01/20 2059    Milton Ferguson, MD 09/02/20 1028

## 2020-10-09 ENCOUNTER — Other Ambulatory Visit (HOSPITAL_COMMUNITY): Payer: Self-pay | Admitting: Vascular Surgery

## 2020-10-09 ENCOUNTER — Encounter: Payer: Self-pay | Admitting: Vascular Surgery

## 2020-10-09 ENCOUNTER — Other Ambulatory Visit: Payer: Self-pay

## 2020-10-09 ENCOUNTER — Ambulatory Visit (INDEPENDENT_AMBULATORY_CARE_PROVIDER_SITE_OTHER): Payer: Medicare PPO

## 2020-10-09 ENCOUNTER — Ambulatory Visit (INDEPENDENT_AMBULATORY_CARE_PROVIDER_SITE_OTHER): Payer: Medicare PPO | Admitting: Vascular Surgery

## 2020-10-09 VITALS — BP 127/74 | HR 73 | Temp 98.4°F | Ht 69.0 in | Wt 188.0 lb

## 2020-10-09 DIAGNOSIS — I779 Disorder of arteries and arterioles, unspecified: Secondary | ICD-10-CM

## 2020-10-09 DIAGNOSIS — I6523 Occlusion and stenosis of bilateral carotid arteries: Secondary | ICD-10-CM

## 2020-10-09 NOTE — Progress Notes (Signed)
[facility] [place]  Patient name: Randy Pena MRN: [id number] DOB: December 08, 1940 Sex: male  REASON FOR CONSULT: Evaluation carotid stenosis  HPI: Randy Pena is a 80 y.o. male, who is here today for evaluation of carotid stenosis.  He is very active 80 year old gentleman who presented to the emergency room several weeks ago with symptoms concerning for coronary ischemia.  Was having chest pain radiating to his back.  He also had some blurred vision.  He did have prior carotid duplex in the past showing moderate carotid disease.  He did complain of some flushing sensation in his head and bilateral blurred vision.  He underwent a CT angiogram on 09/13/2020 at [facility] and I have this for review.  This did reveal 70% left internal carotid stenosis and 50% right internal carotid artery stenosis.  He is here today for further discussion.  He reports unusual symptom complex.  He reports that over the last 6 months he has had some unsteady gait.  He has what he describes as a rushing sensation in his head and seems to feel that this is more frequent after a full meal.  He specifically denies episodes of amaurosis fugax, transient ischemic attack, aphasia or stroke.  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   Spinal stenosis     Family History  Problem Relation Age of Onset   Heart disease Other    Arthritis Other    Cancer Other    Diabetes Other     SOCIAL HISTORY: Social History   Socioeconomic History   Marital status: Married    Spouse name: Not on file   Number of children: Not on file   Years of education: BS/masters   Highest education level: Not on file  Occupational History   Occupation: farmer    Employer: RETIRED  Tobacco Use   Smoking  status: Never   Smokeless tobacco: Never  Vaping Use   Vaping Use: Never used  Substance and Sexual Activity   Alcohol use: Yes    Comment: social, one drink maybe every 2 weeks or so per patient   Drug use: No   Sexual activity: Yes    Birth control/protection: None  Other Topics Concern   Not on file  Social History Narrative   Not on file   Social Determinants of Health   Financial Resource Strain: Not on file  Food Insecurity: Not on file  Transportation Needs: Not on file  Physical Activity: Not on file  Stress: Not on file  Social Connections: Not on file  Intimate Partner Violence: Not on file    Allergies  Allergen Reactions   No Known Allergies    Statins     Muscle pain and stiffness    Current Outpatient Medications  Medication Sig Dispense Refill   aspirin EC 81 MG tablet Take 81 mg by mouth daily.     atorvastatin (LIPITOR) 10 MG tablet Take 1 tablet by mouth every evening.     clopidogrel (PLAVIX) 75 MG tablet Take 75 mg by mouth daily.     lisinopril (PRINIVIL,ZESTRIL) 20 MG tablet Take 20 mg by mouth daily.       Multiple Vitamin (MULTIVITAMIN WITH MINERALS) TABS tablet Take 1 tablet by mouth daily.  No current facility-administered medications for this visit.    REVIEW OF SYSTEMS:  '[X]'$  denotes positive finding, '[ ]'$  denotes negative finding Cardiac  Comments:  Chest pain or chest pressure:    Shortness of breath upon exertion: x   Short of breath when lying flat:    Irregular heart rhythm:        Vascular    Pain in calf, thigh, or hip brought on by ambulation: x   Pain in feet at night that wakes you up from your sleep:     Blood clot in your veins:    Leg swelling:  x       Pulmonary    Oxygen at home:    Productive cough:     Wheezing:         Neurologic    Sudden weakness in arms or legs:     Sudden numbness in arms or legs:     Sudden onset of difficulty speaking or slurred speech:    Temporary loss of vision in one eye:      Problems with dizziness:  x       Gastrointestinal    Blood in stool:     Vomited blood:         Genitourinary    Burning when urinating:     Blood in urine:        Psychiatric    Major depression:         Hematologic    Bleeding problems:    Problems with blood clotting too easily:        Skin    Rashes or ulcers:        Constitutional    Fever or chills:      PHYSICAL EXAM: Vitals:   10/09/20 1013 10/09/20 1019  BP: (!) 146/75 127/74  Pulse: 74 73  Temp: 98.4 F (36.9 C)   TempSrc: Temporal   SpO2: 99% 98%  Weight: 188 lb (85.3 kg)   Height: '5\' 9"'$  (1.753 m)     GENERAL: The patient is a well-nourished male, in no acute distress. The vital signs are documented above. CARDIOVASCULAR: He does have a soft left carotid bruit and no bruit on the right.  2+ radial pulses bilaterally PULMONARY: There is good air exchange  MUSCULOSKELETAL: There are no major deformities or cyanosis. NEUROLOGIC: No focal weakness or paresthesias are detected. SKIN: There are no ulcers or rashes noted. PSYCHIATRIC: The patient has a normal affect.  DATA:  Reviewed his CT angiogram from 09/13/2020.  This does show severe calcification and moderate to severe stenosis in his left internal carotid artery.  Duplex today suggests 60 to 79% stenosis in his left internal carotid artery and 40 to 59% right carotid stenosis.  MEDICAL ISSUES: Had a long discussion with the patient regarding these findings.  I do not feel that he is having any symptoms related to carotid narrowing.  I did explain that he is below the threshold of where we would recommend endarterectomy for asymptomatic disease.  He does not have markedly elevated end-diastolic flow on his left internal carotid artery.  I did again review symptoms of carotid disease and he knows to report immediately to the emergency room should he develop any focal symptoms.  Otherwise I recommended that we see him at 89-monthintervals to rule out  asymptomatic progression.  We will see him again with carotid duplex in 6 months   TRosetta Posner MD FShelby [facility] LLC[facility]  [place] Tel [phone] Pager [phone]  Note: Portions of this report may have been transcribed using voice recognition software.  Every effort has been made to ensure accuracy; however, inadvertent computerized transcription errors may still be present.

## 2020-10-11 ENCOUNTER — Other Ambulatory Visit: Payer: Self-pay

## 2020-10-11 DIAGNOSIS — I779 Disorder of arteries and arterioles, unspecified: Secondary | ICD-10-CM

## 2021-04-08 ENCOUNTER — Other Ambulatory Visit: Payer: Self-pay | Admitting: *Deleted

## 2021-04-08 DIAGNOSIS — I779 Disorder of arteries and arterioles, unspecified: Secondary | ICD-10-CM

## 2021-04-09 ENCOUNTER — Other Ambulatory Visit: Payer: Self-pay

## 2021-04-09 ENCOUNTER — Encounter: Payer: Self-pay | Admitting: Vascular Surgery

## 2021-04-09 ENCOUNTER — Ambulatory Visit (INDEPENDENT_AMBULATORY_CARE_PROVIDER_SITE_OTHER): Payer: Medicare PPO

## 2021-04-09 ENCOUNTER — Ambulatory Visit: Payer: Medicare PPO | Admitting: Vascular Surgery

## 2021-04-09 VITALS — BP 151/75 | HR 61 | Temp 98.9°F | Resp 18 | Ht 70.0 in | Wt 198.1 lb

## 2021-04-09 DIAGNOSIS — I779 Disorder of arteries and arterioles, unspecified: Secondary | ICD-10-CM | POA: Diagnosis not present

## 2021-04-09 NOTE — Progress Notes (Signed)
? ? [facility] [place] ? ?Patient name: Randy Pena MRN: [id number] DOB: 09-Aug-1940 Sex: male ? ?REASON FOR VISIT: Follow-up asymptomatic carotid disease ? ?HPI: ?Randy Pena is a 81 y.o. male here today for follow-up.  He reports no new medical difficulty since my last visit with him.  He initially had work-up due to some lightheadedness and unsteady gait.  He had negative work-up and had a CT angiogram which showed asymptomatic carotid disease.  He is here today for follow-up ? ?Past Medical History:  ?Diagnosis Date  ? Cancer [facility])   ? 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 Carotid artery occlusion   ? DJD (degenerative joint disease)   ? HTN (hypertension)   ? 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 Spinal stenosis   ? ? ?Family History  ?Problem Relation Age of Onset  ? Heart disease Other   ? Arthritis Other   ? Cancer Other   ? Diabetes Other   ? ? ?SOCIAL HISTORY: ?Social History  ? ?Tobacco Use  ? Smoking status: Never  ? Smokeless tobacco: Never  ?Substance Use Topics  ? Alcohol use: Yes  ?  Comment: social, one drink maybe every 2 weeks or so per patient  ? ? ?Allergies  ?Allergen Reactions  ? No Known Allergies   ? Statins   ?  Muscle pain and stiffness  ? ? ?Current Outpatient Medications  ?Medication Sig Dispense Refill  ? aspirin EC 81 MG tablet Take 81 mg by mouth daily.    ? atorvastatin (LIPITOR) 10 MG tablet Take 1 tablet by mouth every evening.    ? clopidogrel (PLAVIX) 75 MG tablet Take 75 mg by mouth daily.    ? lisinopril (PRINIVIL,ZESTRIL) 20 MG tablet Take 20 mg by mouth daily.      ? Multiple Vitamin (MULTIVITAMIN WITH MINERALS) TABS tablet Take 1 tablet by mouth daily.    ? ?No current facility-administered medications for this visit.  ? ? ?REVIEW OF SYSTEMS:  ?[X]  denotes positive finding, [ ]  denotes negative finding ?Cardiac  Comments:  ?Chest pain or chest pressure:    ?Shortness of breath upon exertion:    ?Short of breath when  lying flat:    ?Irregular heart rhythm:    ?    ?Vascular    ?Pain in calf, thigh, or hip brought on by ambulation:    ?Pain in feet at night that wakes you up from your sleep:     ?Blood clot in your veins:    ?Leg swelling:     ?    ? ? ?PHYSICAL EXAM: ?Vitals:  ? 04/09/21 0835  ?BP: (!) 151/75  ?Pulse: 61  ?Resp: 18  ?Temp: 98.9 ?F (37.2 ?C)  ?TempSrc: Temporal  ?SpO2: 97%  ?Weight: 198 lb 0.8 oz (89.8 kg)  ?Height: 5\' 10"  (1.778 m)  ? ? ?GENERAL: The patient is a well-nourished male, in no acute distress. The vital signs are documented above. ?CARDIOVASCULAR: Carotid arteries without bruits bilaterally.  2+ radial pulses bilaterally. ?PULMONARY: There is good air exchange  ?MUSCULOSKELETAL: There are no major deformities or cyanosis. ?NEUROLOGIC: No focal weakness or paresthesias are detected. ?SKIN: There are no ulcers or rashes noted. ?PSYCHIATRIC: The patient has a normal affect. ? ?DATA:  ?Carotid duplex today reveals no evidence of critical stenosis.  He has 60 to 79% stenosis in his left internal carotid and 1 to 39% stenosis in his right internal carotid artery. ? ?CT scan from August 2022  showed predicted 70% left and 50% right internal carotid artery stenosis ? ?MEDICAL ISSUES: ?Discussed these findings with the patient.  He knows to report immediately to the emergency room should he develop any neurologic deficits.  Otherwise we will see him again in 6 months for follow-up of his carotid stenosis with duplex ? ? ? ?Rosetta Posner, MD FACS ?[facility] [place] ?Office Tel [phone] ? ?Note: Portions of this report may have been transcribed using voice recognition software.  Every effort has been made to ensure accuracy; however, inadvertent computerized transcription errors may still be present. ?

## 2021-04-11 ENCOUNTER — Other Ambulatory Visit: Payer: Self-pay | Admitting: *Deleted

## 2021-04-11 DIAGNOSIS — I779 Disorder of arteries and arterioles, unspecified: Secondary | ICD-10-CM

## 2021-06-09 ENCOUNTER — Ambulatory Visit (INDEPENDENT_AMBULATORY_CARE_PROVIDER_SITE_OTHER): Payer: Medicare PPO

## 2021-06-09 ENCOUNTER — Ambulatory Visit: Payer: Medicare PPO | Admitting: Orthopedic Surgery

## 2021-06-09 DIAGNOSIS — M25562 Pain in left knee: Secondary | ICD-10-CM | POA: Diagnosis not present

## 2021-06-09 DIAGNOSIS — M1712 Unilateral primary osteoarthritis, left knee: Secondary | ICD-10-CM

## 2021-06-09 NOTE — Progress Notes (Signed)
Chief Complaint  ?Patient presents with  ? Knee Pain  ?  Left, started hurting last week waking him up at night  ? ? ?HPI: 81 year old male status post right total knee doing well comes in with left knee pain for about 3 weeks.  It started to wake him up at night.  It seems to be associated with a little tenderness as he calls it.  No trauma no injury.  He takes a little Advil here and there as needed nothing specifically for the pain he is having now ? ?Past Medical History:  ?Diagnosis Date  ? Cancer [facility])   ? 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 Carotid artery occlusion   ? DJD (degenerative joint disease)   ? HTN (hypertension)   ? 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 Spinal stenosis   ? ? ?There were no vitals taken for this visit. ? ? ?General appearance: Well-developed well-nourished no gross deformities ? ?Cardiovascular normal pulse and perfusion normal color without edema ? ?Neurologically no sensation loss or deficits or pathologic reflexes ? ?Psychological: Awake alert and oriented x3 mood and affect normal ? ?Skin no lacerations or ulcerations no nodularity no palpable masses, no erythema or nodularity ? ?Musculoskeletal: Left knee lateral joint line tenderness no effusion full extension full flexion no instability ? ?Imaging x-rays of the knee ? ?A/P ? ?Mr. Makarewicz x-rays that show grade 2-3 disease but nowhere near needing knee replacement he is amenable to injection ? ?Procedure note left knee injection  ? ?verbal consent was obtained to inject left knee joint ? ?Timeout was completed to confirm the site of injection ? ?The medications used were depomedrol 40 mg and 1% lidocaine 3 cc ?Anesthesia was provided by ethyl chloride and the skin was prepped with alcohol. ? ?After cleaning the skin with alcohol a 20-gauge needle was used to inject the left knee joint. There were no complications. A sterile bandage was applied. ?  ?

## 2021-06-09 NOTE — Patient Instructions (Signed)
Follow up as needed after LT knee injection ?

## 2021-09-02 ENCOUNTER — Ambulatory Visit: Payer: Medicare PPO

## 2021-09-02 ENCOUNTER — Ambulatory Visit: Payer: Medicare PPO | Admitting: Orthopaedic Surgery

## 2021-09-02 ENCOUNTER — Ambulatory Visit (INDEPENDENT_AMBULATORY_CARE_PROVIDER_SITE_OTHER): Payer: Medicare PPO

## 2021-09-02 ENCOUNTER — Encounter: Payer: Self-pay | Admitting: Orthopaedic Surgery

## 2021-09-02 VITALS — BP 143/74 | HR 77 | Ht 70.0 in | Wt 190.0 lb

## 2021-09-02 DIAGNOSIS — G5601 Carpal tunnel syndrome, right upper limb: Secondary | ICD-10-CM | POA: Diagnosis not present

## 2021-09-02 DIAGNOSIS — M65312 Trigger thumb, left thumb: Secondary | ICD-10-CM | POA: Diagnosis not present

## 2021-09-02 DIAGNOSIS — G5602 Carpal tunnel syndrome, left upper limb: Secondary | ICD-10-CM | POA: Diagnosis not present

## 2021-09-02 NOTE — Progress Notes (Signed)
I have two problems: My left thumb is popping and hurting and my right hand goes numb at night.  His left thumb has been triggering over the last six to eight weeks.  He has no trauma.  He has no weakness.  It is not getting better.  His right hand goes numb at night, more of the median nerve distribution.  He has no trauma.  It is not improved over the last year and getting worse.  He has some numbness of the left hand at times.  Left thumb has pain at the A1 pulley area and has popping and triggering.  NV intact.  Right hand has numbness in the median nerve distribution and positive Phalen and Tinel signs. He has right thenar atrophy but not on the left.   X-rays were done of the left thumb, reported separately.  Encounter Diagnoses  Name Primary?   Trigger finger of left thumb Yes   Carpal tunnel syndrome, right upper limb    PROCEDURE  Trigger Finger Injection  The left Thumb  has been locking at the A1 pulley.  The patient has been told about injection of the digit.  Surgical correction and excision of the A1 pulley will resolve the problem.  Ani injection in the digit should help but the results may be short lived.  The patient asked appropriate questions and understands the procedure.  The patient has elected for an injection at this time.  Verbal consent was obtained.  A timeout was taken to confirm the proper hand and digit.  Medication  1 mL of DepoMedrol 40 mg  2 mL of 1% lidocaine plain  Ethyl chloride for anesthesia  Alcohol was used to prepare the skin along with ethyl chloride and then the injection was made at the A1 pulley there were no complications.  It was tolerated well.  A Band-aid dressing was applied.  Call if any problem or difficulty.  I have explained that surgery is the definitive treatment for this problem.  I will get EMGs of the upper extremities to rule out carpal tunnel.  Return in two weeks.  Call if any problem.  Precautions  discussed.  Electronically Signed Sanjuana Kava, MD 7/25/20239:17 AM

## 2021-09-30 ENCOUNTER — Encounter: Payer: Self-pay | Admitting: Physical Medicine and Rehabilitation

## 2021-09-30 ENCOUNTER — Ambulatory Visit: Payer: Medicare PPO | Admitting: Physical Medicine and Rehabilitation

## 2021-09-30 DIAGNOSIS — R202 Paresthesia of skin: Secondary | ICD-10-CM | POA: Diagnosis not present

## 2021-09-30 NOTE — Progress Notes (Signed)
Pt state both hands numbness, tingling and pain. Pt state his left hand thumb has popping and swollen. Pt state right hand thumb, pointer and middle finger has numbness. Pt state he was told he has trigger thumb in his left hand. Pt state he has dropped and lost items. Pt state he takes over the counter pain meds to help ease his pain. Pt state he is right handed.  Numeric Pain Rating Scale and Functional Assessment Average Pain 7   In the last MONTH (on 0-10 scale) has pain interfered with the following?  1. General activity like being  able to carry out your everyday physical activities such as walking, climbing stairs, carrying groceries, or moving a chair?  Rating(9)   +BT,

## 2021-09-30 NOTE — Progress Notes (Signed)
Randy Pena - 81 y.o. male MRN [id number]  Date of birth: 1940-05-24  Office Visit Note: Visit Date: 09/30/2021 PCP: Leeanne Rio, MD Referred by: Sanjuana Kava, MD  Subjective: Chief Complaint  Patient presents with   Right Hand - Numbness, Pain, Tingling   Left Hand - Pain, Numbness, Tingling   HPI:  Randy Pena is a 81 y.o. male who comes in today at the request of Dr. Sanjuana Kava for electrodiagnostic study of the Bilateral upper extremities.  Patient is Right hand dominant.   ROS Otherwise per HPI.  Assessment & Plan: Visit Diagnoses:    ICD-10-CM   1. 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 The lesion is characterized by sensory and motor demyelination with evidence of significant axonal injury.   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   There is no significant electrodiagnostic evidence of any other focal nerve entrapment, brachial plexopathy or cervical radiculopathy.   Recommendations: 1.  Follow-up with referring physician. 2.  Continue current management of symptoms. 3.  Suggest surgical evaluation.  Meds & Orders: No orders of the defined types were placed in this encounter.   Orders Placed This Encounter  Procedures   NCV with EMG (electromyography)    Follow-up: Return in about 2 weeks (around 10/14/2021) for Sanjuana Kava, MD.   Procedures: No procedures performed  EMG & NCV Findings: Evaluation of the left median motor and the right median motor nerves showed prolonged distal onset latency (L8.0, R6.5 ms), reduced amplitude (L1.7, R0.1 mV), and decreased conduction velocity (Elbow-Wrist, L48, R33 m/s).  The left ulnar motor and the right ulnar motor  nerves showed decreased conduction velocity (B Elbow-Wrist, L47, R42 m/s) and decreased conduction velocity (A Elbow-B Elbow, L40, R48 m/s).  The left median (across palm) sensory nerve showed prolonged distal peak latency (Wrist, 6.9 ms), reduced amplitude (3.0 V), and prolonged distal peak latency (Palm, 6.0 ms).  The right median (across palm) sensory nerve showed no response (Palm), prolonged distal peak latency (4.7 ms), and reduced amplitude (3.4 V).  The right ulnar sensory nerve showed prolonged distal peak latency (3.8 ms) and decreased conduction velocity (Wrist-5th Digit, 37 m/s).  All remaining nerves (as indicated in the following tables) were within normal limits.  Left vs. Right side comparison data for the median motor nerve indicates abnormal L-R latency difference (1.5 ms), abnormal L-R amplitude difference (94.1 %), and abnormal L-R velocity difference (Elbow-Wrist, 15 m/s).  The ulnar sensory nerve indicates abnormal L-R latency difference (0.5 ms).  All remaining left vs. right side differences were within normal limits.    All examined muscles (as indicated in the following table) showed no evidence of electrical instability.    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 The lesion is characterized by sensory and motor demyelination with evidence of significant axonal injury.   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   There is no significant electrodiagnostic evidence of any other focal nerve entrapment, brachial plexopathy or cervical radiculopathy.   Recommendations: 1.  Follow-up with referring physician. 2.  Continue current management of  symptoms. 3.  Suggest surgical evaluation.  ___________________________ Randy Pena FAAPMR Board Certified, American  Board of Physical Medicine and Rehabilitation    Nerve Conduction Studies Anti Sensory Summary Table   Stim Site NR Peak (ms) Norm Peak (ms) P-T Amp (V) Norm P-T Amp Site1 Site2 Delta-P (ms) Dist (cm) Vel (m/s) Norm Vel (m/s)  Left Median Acr Palm Anti Sensory (2nd Digit)  31.8C  Wrist    *6.9 <3.6 *3.0 >10 Wrist Palm 0.9 0.0    Palm    *6.0 <2.0 4.2         Right Median Acr Palm Anti Sensory (2nd Digit)  31.4C  Wrist    *4.7 <3.6 *3.4 >10 Wrist Palm  0.0    Palm *NR  <2.0          Left Radial Anti Sensory (Base 1st Digit)  31.3C  Wrist    2.1 <3.1 17.2  Wrist Base 1st Digit 2.1 0.0    Right Radial Anti Sensory (Base 1st Digit)  31.3C  Wrist    2.3 <3.1 11.3  Wrist Base 1st Digit 2.3 0.0    Left Ulnar Anti Sensory (5th Digit)  31.9C  Wrist    3.3 <3.7 26.4 >15.0 Wrist 5th Digit 3.3 14.0 42 >38  Right Ulnar Anti Sensory (5th Digit)  31.5C  Wrist    *3.8 <3.7 15.1 >15.0 Wrist 5th Digit 3.8 14.0 *37 >38   Motor Summary Table   Stim Site NR Onset (ms) Norm Onset (ms) O-P Amp (mV) Norm O-P Amp Site1 Site2 Delta-0 (ms) Dist (cm) Vel (m/s) Norm Vel (m/s)  Left Median Motor (Abd Poll Brev)  31.4C  Wrist    *8.0 <4.2 *1.7 >5 Elbow Wrist 4.8 23.0 *48 >50  Elbow    12.8  1.9         Right Median Motor (Abd Poll Brev)  31.4C  Wrist    *6.5 <4.2 *0.1 >5 Elbow Wrist 7.0 23.0 *33 >50  Elbow    13.5  0.8         Left Ulnar Motor (Abd Dig Min)  31.8C  Wrist    3.2 <4.2 7.2 >3 B Elbow Wrist 4.7 22.0 *47 >53  B Elbow    7.9  4.9  A Elbow B Elbow 2.5 10.0 *40 >53  A Elbow    10.4  3.3         Right Ulnar Motor (Abd Dig Min)  31.4C  Wrist    3.1 <4.2 7.8 >3 B Elbow Wrist 5.3 22.0 *42 >53  B Elbow    8.4  5.3  A Elbow B Elbow 2.3 11.0 *48 >53  A Elbow    10.7  4.7          EMG   Side Muscle Nerve Root Ins Act Fibs Psw Amp Dur Poly Recrt Int Fraser Din Comment  Right Abd Poll Brev Median C8-T1 Nml Nml Nml Nml Nml 0 Nml Nml   Right 1stDorInt Ulnar C8-T1 Nml Nml Nml Nml Nml 0 Nml Nml   Right  PronatorTeres Median C6-7 Nml Nml Nml Nml Nml 0 Nml Nml   Right Biceps Musculocut C5-6 Nml Nml Nml Nml Nml 0 Nml Nml   Right Deltoid Axillary C5-6 Nml Nml Nml Nml Nml 0 Nml Nml   Left Abd Poll Brev Median C8-T1 Nml Nml Nml Nml Nml 0 Nml Nml   Left 1stDorInt Ulnar C8-T1 Nml Nml Nml Nml Nml 0 Nml Nml   Left PronatorTeres Median C6-7 Nml Nml  Nml Nml Nml 0 Nml Nml   Left Biceps Musculocut C5-6 Nml Nml Nml Nml Nml 0 Nml Nml   Left Deltoid Axillary C5-6 Nml Nml Nml Nml Nml 0 Nml Nml     Nerve Conduction Studies Anti Sensory Left/Right Comparison   Stim Site L Lat (ms) R Lat (ms) L-R Lat (ms) L Amp (V) R Amp (V) L-R Amp (%) Site1 Site2 L Vel (m/s) R Vel (m/s) L-R Vel (m/s)  Median Acr Palm Anti Sensory (2nd Digit)  31.8C  Wrist *6.9 *4.7 2.2 *3.0 *3.4 11.8 Wrist Palm     Palm *6.0   4.2         Radial Anti Sensory (Base 1st Digit)  31.3C  Wrist 2.1 2.3 0.2 17.2 11.3 34.3 Wrist Base 1st Digit     Ulnar Anti Sensory (5th Digit)  31.9C  Wrist 3.3 *3.8 *0.5 26.4 15.1 42.8 Wrist 5th Digit 42 *37 5   Motor Left/Right Comparison   Stim Site L Lat (ms) R Lat (ms) L-R Lat (ms) L Amp (mV) R Amp (mV) L-R Amp (%) Site1 Site2 L Vel (m/s) R Vel (m/s) L-R Vel (m/s)  Median Motor (Abd Poll Brev)  31.4C  Wrist *8.0 *6.5 *1.5 *1.7 *0.1 *94.1 Elbow Wrist *48 *33 *15  Elbow 12.8 13.5 0.7 1.9 0.8 57.9       Ulnar Motor (Abd Dig Min)  31.8C  Wrist 3.2 3.1 0.1 7.2 7.8 7.7 B Elbow Wrist *47 *42 5  B Elbow 7.9 8.4 0.5 4.9 5.3 7.5 A Elbow B Elbow *40 *48 8  A Elbow 10.4 10.7 0.3 3.3 4.7 29.8          Waveforms:                      Clinical History: No specialty comments available.     Objective:  VS:  HT:    WT:   BMI:     BP:   HR: bpm  TEMP: ( )  RESP:  Physical Exam Musculoskeletal:        General: No tenderness.     Comments: Inspection reveals some atrophy of the bilateral APB or FDI or hand intrinsics. There is no swelling, color changes, allodynia or dystrophic changes.  There is 5 out of 5 strength in the bilateral wrist extension, finger abduction and long finger flexion. There is decreased sensation to light touch in predominantly the median nerves distributions in booth hands.  There is a negative Hoffmann's test bilaterally.  Skin:    General: Skin is warm and dry.     Findings: No erythema or rash.  Neurological:     General: No focal deficit present.     Mental Status: He is alert and oriented to person, place, and time.     Sensory: No sensory deficit.     Motor: No weakness or abnormal muscle tone.     Coordination: Coordination normal.     Gait: Gait normal.  Psychiatric:        Mood and Affect: Mood normal.        Behavior: Behavior normal.        Thought Content: Thought content normal.      Imaging: No results found.

## 2021-10-06 NOTE — Procedures (Signed)
EMG & NCV Findings: Evaluation of the left median motor and the right median motor nerves showed prolonged distal onset latency (L8.0, R6.5 ms), reduced amplitude (L1.7, R0.1 mV), and decreased conduction velocity (Elbow-Wrist, L48, R33 m/s).  The left ulnar motor and the right ulnar motor nerves showed decreased conduction velocity (B Elbow-Wrist, L47, R42 m/s) and decreased conduction velocity (A Elbow-B Elbow, L40, R48 m/s).  The left median (across palm) sensory nerve showed prolonged distal peak latency (Wrist, 6.9 ms), reduced amplitude (3.0 V), and prolonged distal peak latency (Palm, 6.0 ms).  The right median (across palm) sensory nerve showed no response (Palm), prolonged distal peak latency (4.7 ms), and reduced amplitude (3.4 V).  The right ulnar sensory nerve showed prolonged distal peak latency (3.8 ms) and decreased conduction velocity (Wrist-5th Digit, 37 m/s).  All remaining nerves (as indicated in the following tables) were within normal limits.  Left vs. Right side comparison data for the median motor nerve indicates abnormal L-R latency difference (1.5 ms), abnormal L-R amplitude difference (94.1 %), and abnormal L-R velocity difference (Elbow-Wrist, 15 m/s).  The ulnar sensory nerve indicates abnormal L-R latency difference (0.5 ms).  All remaining left vs. right side differences were within normal limits.    All examined muscles (as indicated in the following table) showed no evidence of electrical instability.    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 The lesion is characterized by sensory and motor demyelination with evidence of significant axonal injury.   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   There is no significant electrodiagnostic evidence of any other  focal nerve entrapment, brachial plexopathy or cervical radiculopathy.   Recommendations: 1.  Follow-up with referring physician. 2.  Continue current management of symptoms. 3.  Suggest surgical evaluation.  ___________________________ Laurence Spates FAAPMR Board Certified, American Board of Physical Medicine and Rehabilitation    Nerve Conduction Studies Anti Sensory Summary Table   Stim Site NR Peak (ms) Norm Peak (ms) P-T Amp (V) Norm P-T Amp Site1 Site2 Delta-P (ms) Dist (cm) Vel (m/s) Norm Vel (m/s)  Left Median Acr Palm Anti Sensory (2nd Digit)  31.8C  Wrist    *6.9 <3.6 *3.0 >10 Wrist Palm 0.9 0.0    Palm    *6.0 <2.0 4.2         Right Median Acr Palm Anti Sensory (2nd Digit)  31.4C  Wrist    *4.7 <3.6 *3.4 >10 Wrist Palm  0.0    Palm *NR  <2.0          Left Radial Anti Sensory (Base 1st Digit)  31.3C  Wrist    2.1 <3.1 17.2  Wrist Base 1st Digit 2.1 0.0    Right Radial Anti Sensory (Base 1st Digit)  31.3C  Wrist    2.3 <3.1 11.3  Wrist Base 1st Digit 2.3 0.0    Left Ulnar Anti Sensory (5th Digit)  31.9C  Wrist    3.3 <3.7 26.4 >15.0 Wrist 5th Digit 3.3 14.0 42 >38  Right Ulnar Anti Sensory (5th Digit)  31.5C  Wrist    *3.8 <3.7 15.1 >15.0 Wrist 5th Digit 3.8 14.0 *37 >38   Motor Summary Table   Stim Site NR Onset (ms) Norm Onset (ms) O-P Amp (mV) Norm O-P Amp Site1 Site2 Delta-0 (ms) Dist (cm) Vel (m/s) Norm Vel (m/s)  Left Median  Motor (Abd Poll Brev)  31.4C  Wrist    *8.0 <4.2 *1.7 >5 Elbow Wrist 4.8 23.0 *48 >50  Elbow    12.8  1.9         Right Median Motor (Abd Poll Brev)  31.4C  Wrist    *6.5 <4.2 *0.1 >5 Elbow Wrist 7.0 23.0 *33 >50  Elbow    13.5  0.8         Left Ulnar Motor (Abd Dig Min)  31.8C  Wrist    3.2 <4.2 7.2 >3 B Elbow Wrist 4.7 22.0 *47 >53  B Elbow    7.9  4.9  A Elbow B Elbow 2.5 10.0 *40 >53  A Elbow    10.4  3.3         Right Ulnar Motor (Abd Dig Min)  31.4C  Wrist    3.1 <4.2 7.8 >3 B Elbow Wrist 5.3 22.0 *42 >53  B Elbow    8.4   5.3  A Elbow B Elbow 2.3 11.0 *48 >53  A Elbow    10.7  4.7          EMG   Side Muscle Nerve Root Ins Act Fibs Psw Amp Dur Poly Recrt Int Fraser Din Comment  Right Abd Poll Brev Median C8-T1 Nml Nml Nml Nml Nml 0 Nml Nml   Right 1stDorInt Ulnar C8-T1 Nml Nml Nml Nml Nml 0 Nml Nml   Right PronatorTeres Median C6-7 Nml Nml Nml Nml Nml 0 Nml Nml   Right Biceps Musculocut C5-6 Nml Nml Nml Nml Nml 0 Nml Nml   Right Deltoid Axillary C5-6 Nml Nml Nml Nml Nml 0 Nml Nml   Left Abd Poll Brev Median C8-T1 Nml Nml Nml Nml Nml 0 Nml Nml   Left 1stDorInt Ulnar C8-T1 Nml Nml Nml Nml Nml 0 Nml Nml   Left PronatorTeres Median C6-7 Nml Nml Nml Nml Nml 0 Nml Nml   Left Biceps Musculocut C5-6 Nml Nml Nml Nml Nml 0 Nml Nml   Left Deltoid Axillary C5-6 Nml Nml Nml Nml Nml 0 Nml Nml     Nerve Conduction Studies Anti Sensory Left/Right Comparison   Stim Site L Lat (ms) R Lat (ms) L-R Lat (ms) L Amp (V) R Amp (V) L-R Amp (%) Site1 Site2 L Vel (m/s) R Vel (m/s) L-R Vel (m/s)  Median Acr Palm Anti Sensory (2nd Digit)  31.8C  Wrist *6.9 *4.7 2.2 *3.0 *3.4 11.8 Wrist Palm     Palm *6.0   4.2         Radial Anti Sensory (Base 1st Digit)  31.3C  Wrist 2.1 2.3 0.2 17.2 11.3 34.3 Wrist Base 1st Digit     Ulnar Anti Sensory (5th Digit)  31.9C  Wrist 3.3 *3.8 *0.5 26.4 15.1 42.8 Wrist 5th Digit 42 *37 5   Motor Left/Right Comparison   Stim Site L Lat (ms) R Lat (ms) L-R Lat (ms) L Amp (mV) R Amp (mV) L-R Amp (%) Site1 Site2 L Vel (m/s) R Vel (m/s) L-R Vel (m/s)  Median Motor (Abd Poll Brev)  31.4C  Wrist *8.0 *6.5 *1.5 *1.7 *0.1 *94.1 Elbow Wrist *48 *33 *15  Elbow 12.8 13.5 0.7 1.9 0.8 57.9       Ulnar Motor (Abd Dig Min)  31.8C  Wrist 3.2 3.1 0.1 7.2 7.8 7.7 B Elbow Wrist *47 *42 5  B Elbow 7.9 8.4 0.5 4.9 5.3 7.5 A Elbow B Elbow *40 *48 8  A Elbow 10.4 10.7 0.3 3.3  4.7 29.8          Waveforms:

## 2021-10-07 ENCOUNTER — Ambulatory Visit: Payer: Medicare PPO | Admitting: Orthopaedic Surgery

## 2021-10-07 ENCOUNTER — Encounter: Payer: Self-pay | Admitting: Orthopaedic Surgery

## 2021-10-07 VITALS — Ht 70.0 in | Wt 192.0 lb

## 2021-10-07 DIAGNOSIS — G5603 Carpal tunnel syndrome, bilateral upper limbs: Secondary | ICD-10-CM | POA: Diagnosis not present

## 2021-10-07 DIAGNOSIS — G5601 Carpal tunnel syndrome, right upper limb: Secondary | ICD-10-CM

## 2021-10-07 DIAGNOSIS — G5602 Carpal tunnel syndrome, left upper limb: Secondary | ICD-10-CM

## 2021-10-07 NOTE — Progress Notes (Signed)
My hand is still numb.  He had EMGs done by Dr. Ernestina Patches.  I have read the report.  The patient has bilateral severe carpal tunnel syndrome.  He is having more pain on the right.  He also has trigger thumb of left.  I have explained the findings to him.  I recommend surgery.  I will have him see Dr. Aline Brochure for this.  He is agreeable.  He has bilateral Phalen sign and Tinel on right.  He has decreases sensation both hands of median nerve distribution.  Encounter Diagnoses  Name Primary?   Carpal tunnel syndrome, right upper limb Yes   Carpal tunnel syndrome, left upper limb    To see Dr. Aline Brochure.  Call if any problem.  Precautions discussed.  Electronically Signed Sanjuana Kava, MD 8/29/20239:16 AM

## 2021-10-07 NOTE — Patient Instructions (Signed)
Schedule patient with Dr.Harrison or Dr.Cairns to discuss CTR surgery bilateral

## 2021-10-15 ENCOUNTER — Encounter: Payer: Self-pay | Admitting: Orthopedic Surgery

## 2021-10-15 ENCOUNTER — Ambulatory Visit (INDEPENDENT_AMBULATORY_CARE_PROVIDER_SITE_OTHER): Payer: Medicare PPO

## 2021-10-15 ENCOUNTER — Ambulatory Visit: Payer: Medicare PPO | Admitting: Vascular Surgery

## 2021-10-15 ENCOUNTER — Encounter: Payer: Self-pay | Admitting: Vascular Surgery

## 2021-10-15 ENCOUNTER — Ambulatory Visit: Payer: Medicare PPO | Admitting: Orthopedic Surgery

## 2021-10-15 VITALS — BP 127/69 | HR 77 | Temp 98.1°F | Ht 70.0 in | Wt 191.2 lb

## 2021-10-15 DIAGNOSIS — I779 Disorder of arteries and arterioles, unspecified: Secondary | ICD-10-CM

## 2021-10-15 DIAGNOSIS — G5601 Carpal tunnel syndrome, right upper limb: Secondary | ICD-10-CM

## 2021-10-15 NOTE — Progress Notes (Signed)
[facility] [place]  Patient name: Randy Pena MRN: [id number] DOB: December 10, 1940 Sex: male  REASON FOR VISIT: For follow-up of asymptomatic carotid disease.  HPI: Randy Pena is a 81 y.o. male here today for follow-up of asymptomatic carotid disease.  He remains very active at his age of 75.  His main complaint currently is of bilateral carpal tunnel syndrome.  He has seen Dr. Aline Brochure for continued evaluation of this.  He specifically denies any neurologic deficits.  No history of amaurosis fugax, aphasia, TIA or stroke.  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     atorvastatin (LIPITOR) 10 MG tablet Take 1 tablet by mouth every evening.     clopidogrel (PLAVIX) 75 MG tablet Take 75 mg by mouth daily.     lisinopril (PRINIVIL,ZESTRIL) 20 MG tablet Take 20 mg by mouth daily.       Multiple Vitamin (MULTIVITAMIN WITH MINERALS) TABS tablet Take 1 tablet by mouth daily.     No current facility-administered medications for this visit.    REVIEW OF SYSTEMS:  '[X]'$  denotes positive finding, '[ ]'$  denotes negative finding Cardiac  Comments:  Chest pain or chest pressure:    Shortness  of breath upon exertion:    Short of breath when lying flat:    Irregular heart rhythm:        Vascular    Pain in calf, thigh, or hip brought on by ambulation:    Pain in feet at night that wakes you up from your sleep:     Blood clot in your veins:    Leg swelling:           PHYSICAL EXAM: Vitals:   10/15/21 0846 10/15/21 0901 10/15/21 0902  BP: 131/75 113/76 127/69  Pulse: 77    Temp: 98.1 F (36.7 C)    Weight: 191 lb 3.2 oz (86.7 kg)    Height: '5\' 10"'$  (1.778 m)      GENERAL: The patient is a well-nourished male, in no acute distress. The vital signs are documented above. CARDIOVASCULAR: Carotid arteries without bruits bilaterally.  2+ radial pulses bilaterally. PULMONARY: There is good air exchange  MUSCULOSKELETAL: There are no major deformities or cyanosis. NEUROLOGIC: No focal weakness or paresthesias are detected. SKIN: There are no ulcers or rashes noted. PSYCHIATRIC: The patient has a normal affect.  DATA:  Rotted duplex today reveals no change since his study 6 months ago.  He is in the 60 to 79% range on the left and mild  narrowing on the right.  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  I again reviewed symptoms of disease with the patient he knows to report to the emergency room immediately should this occur.  Otherwise we will see him again in 6 months with repeat carotid    Rosetta Posner, MD FACS [facility] [phone]  Note: Portions of this report may have been transcribed using voice recognition software.  Every effort has been made to ensure accuracy; however, inadvertent computerized transcription errors may still be present.

## 2021-10-15 NOTE — Patient Instructions (Signed)
Your surgery will be at [facility] by Dr Harrison  The hospital will contact you with a preoperative appointment to discuss Anesthesia.  Please arrive on time or 15 minutes early for the preoperative appointment, they have a very tight schedule if you are late or do not come in your surgery will be cancelled.  The phone number is [phone]. Please bring your medications with you for the appointment. They will tell you the arrival time and medication instructions when you have your preoperative evaluation. Do not wear nail polish the day of your surgery and if you take Phentermine you need to stop this medication ONE WEEK prior to your surgery. If you take Invokana, Farxiga, Jardiance, or Steglatro) - Hold 72 hours before the procedure.  If you take Ozempic,  Bydureon or Trulicity do not take for 8 days before your surgery. If you take Victoza, Rybelsis, Saxenda or Adlyxi stop 24 hours before the procedure.  Please arrive at the hospital 2 hours before procedure if scheduled at 9:30 or later in the day or at the time the nurse tells you at your preoperative visit.   If you have my chart do not use the time given in my chart use the time given to you by the nurse during your preoperative visit.   Your surgery  time may change. Please be available for phone calls the day of your surgery and the day before. The Short Stay department may need to discuss changes about your surgery time. Not reaching the you could lead to procedure delays and possible cancellation.  You must have a ride home and someone to stay with you for 24 to 48 hours. The person taking you home will receive and sign for the your discharge instructions.  Please be prepared to give your support person's name and telephone number to [facility]. Dr Harrison will need that name and phone number post procedure.   

## 2021-10-15 NOTE — Progress Notes (Signed)
Chief Complaint  Patient presents with   Wrist Pain    Bilateral-right worse than left    Patient presents for possible right carpal tunnel release  Complain of pain paresthesias thumb index and long finger with burning tingling numbness  Dr. 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 The lesion is characterized by sensory and motor demyelination with evidence of significant axonal injury.    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    There is no significant electrodiagnostic evidence of any other focal nerve entrapment, brachial plexopathy or cervical radiculopathy.   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

## 2021-10-16 ENCOUNTER — Other Ambulatory Visit: Payer: Self-pay

## 2021-10-16 DIAGNOSIS — I779 Disorder of arteries and arterioles, unspecified: Secondary | ICD-10-CM

## 2021-10-21 ENCOUNTER — Other Ambulatory Visit: Payer: Self-pay | Admitting: Orthopedic Surgery

## 2021-10-21 ENCOUNTER — Telehealth: Payer: Self-pay | Admitting: Radiology

## 2021-10-21 DIAGNOSIS — G5601 Carpal tunnel syndrome, right upper limb: Secondary | ICD-10-CM

## 2021-10-21 DIAGNOSIS — I10 Essential (primary) hypertension: Secondary | ICD-10-CM

## 2021-10-21 NOTE — Telephone Encounter (Signed)
Surgery is 9/19 I have advised him to d/c the Plavix on 10/23/21 he said Hoyle Sauer told him surgery on 9/19/ so Im not sure why its showing on 15th, but will be corrected.   He voiced understanding about d/c Plavix

## 2021-10-21 NOTE — Telephone Encounter (Signed)
-----   Message from Carole Civil, MD sent at 10/21/2021  1:11 PM EDT ----- Regarding: RE: I see 9/15 if its 9/19 stop plavix 9/14 ----- Message ----- From: Candice Camp, RT Sent: 10/21/2021  11:27 AM EDT To: Carole Civil, MD  He is on Plavix, has surgery planned for 10/28/21 I did not see anything in your notes  about his Plavix, is there a plan?  ----- Message ----- From: Josue Hector Sent: 10/21/2021  11:26 AM EDT To: Candice Camp, RT  Pt is out of town but you can reach him on the mobile number listed if you need to call him. ----- Message ----- From: Candice Camp, RT Sent: 10/21/2021  11:24 AM EDT To: Victory Dakin Young  Orders there now. I missed them at office visit, but he is on Plavix, I am looking now to see what plan is for that.  ----- Message ----- From: Josue Hector Sent: 10/21/2021  11:09 AM EDT To: Candice Camp, RT  This patient is calling me stating that he is to have surgery with Dr. Aline Brochure next Tuesday 9/19 and I don't have anything scheduled for him.  Can you give me any information on him?  Thanks,

## 2021-10-21 NOTE — Telephone Encounter (Signed)
Orders put in, this was missed at office visit

## 2021-10-21 NOTE — Addendum Note (Signed)
Addended byCandice Camp on: 10/21/2021 12:54 PM   Modules accepted: Orders

## 2021-10-23 NOTE — Patient Instructions (Signed)
Randy Pena  10/23/2021     '@PREFPERIOPPHARMACY'$ @   Your procedure is scheduled on  10/28/2021.   Report to [facility] at  [place].   Call this number if you have problems the morning of surgery:  [phone]   Remember:  Do not eat or drink after midnight.          Your last dose of plavix should be on 10/23/2021.    Take these medicines the morning of surgery with A SIP OF WATER                                                         None.     Do not wear jewelry, make-up or nail polish.  Do not wear lotions, powders, or perfumes, or deodorant.  Do not shave 48 hours prior to surgery.  Men may shave face and neck.  Do not bring valuables to the hospital.  [facility] is not responsible for any belongings or valuables.  Contacts, dentures or bridgework may not be worn into surgery.  Leave your suitcase in the car.  After surgery it may be brought to your room.  For patients admitted to the hospital, discharge time will be determined by your treatment team.  Patients discharged the day of surgery will not be allowed to drive home and must have someone with them for 24 hours.    Special instructions:   DO NOT smoke tobacco or vape for 24 hours before your procedure.  Please read over the following fact sheets that you were given. Pain Booklet, Coughing and Deep Breathing, Surgical Site Infection Prevention, Anesthesia Post-op Instructions, and Care and Recovery After Surgery      Open Carpal Tunnel Release, Care After This sheet gives you information about how to care for yourself after your procedure. Your health care provider may also give you more specific instructions. If you have problems or questions, contact your health care provider. What can I expect after the procedure? After the procedure, it is common to have: Pain. Swelling. Wrist stiffness. Bruising. Follow these instructions at home: Medicines Take over-the-counter and prescription  medicines only as told by your health care provider. Ask your health care provider if the medicine prescribed to you: Requires you to avoid driving or using machinery. Can cause constipation. You may need to take these actions to prevent or treat constipation: Drink enough fluid to keep your urine pale yellow. Take over-the-counter or prescription medicines. Eat foods that are high in fiber, such as beans, whole grains, and fresh fruits and vegetables. Limit foods that are high in fat and processed sugars, such as fried or sweet foods. Bathing Do not take baths, swim, or use a hot tub until your health care provider approves. Ask your health care provider if you may take showers. Keep your bandage (dressing) dry until your health care provider says it can be removed. Cover it with a watertight covering when you take a bath or a shower. If you have a splint or brace: Wear the splint or brace as told by your health care provider. You may need to wear it for 2-3 weeks. Remove it only as told by your health care provider. Loosen the splint or brace if your fingers tingle, become numb, or  turn cold and blue. Keep the splint or brace clean. If the splint or brace is not waterproof: Do not let it get wet. Cover it with a watertight covering when you take a bath or a shower. Incision care  After the compression bandage has been removed, follow instructions from your health care provider about how to take care of your incision. Make sure you: Wash your hands with soap and water for at least 20 seconds before and after you change your bandage (dressing). If soap and water are not available, use hand sanitizer. Change your dressing as told by your health care provider. Leave stitches (sutures), skin glue, or adhesive strips in place. These skin closures may need to stay in place for 2 weeks or longer. If adhesive strip edges start to loosen and curl up, you may trim the loose edges. Do not remove  adhesive strips completely unless your health care provider tells you to do that. Check your incision area every day for signs of infection. Check for: Redness. More swelling or pain. Fluid or blood. Warmth. Pus or a bad smell. Managing pain, stiffness, and swelling  If directed, put ice on the affected area. If you have a removable splint or brace, remove it as told by your health care provider. Put ice in a plastic bag. Place a towel between your skin and the bag. Leave the ice on for 20 minutes, 2-3 times a day. Do not fall asleep with ice pack on your skin. Remove the ice if your skin turns bright red. This is very important. If you cannot feel pain, heat, or cold, you have a greater risk of damage to the area. Move your fingers often to avoid stiffness and to lessen swelling. Raise (elevate) your wrist above the level of your heart while you are sitting or lying down. Activity Do not drive until your health care provider approves. Use your hand carefully. Do not do activities that cause pain. You should be able to do light activities with your hand. Do not lift with your affected hand until your health care provider approves. Avoid pulling and pushing with the injured arm. Return to your normal activities as told by your health care provider. Ask your health care provider what activities are safe for you. If physical therapy was prescribed, do exercises as told by your therapist. Physical therapy can help you heal faster and regain movement. General instructions Do not use any products that contain nicotine or tobacco, such as cigarettes and e-cigarettes. These can delay incision healing after surgery. If you need help quitting, ask your health care provider. Keep all follow-up visits. This is important. These include visits for physical therapy. Contact a health care provider if: You have redness around your incision. You have more swelling or pain. You have fluid or blood coming  from your incision. Your incision feels warm to the touch. You have pus or a bad smell coming from your incision. You have a fever or chills. You have pain that does not get better with medicine. Your carpal tunnel symptoms do not go away after 2 months. Your carpal tunnel symptoms go away and then come back. Get help right away if: You have pain or numbness that is getting worse. Your fingers or fingertips become very pale or bluish in color. You are not able to move your fingers. Summary It is common to have wrist stiffness and bruising after a carpal tunnel release. Icing and raising (elevating) your wrist may help to  lessen swelling and pain. Call your health care provider if you have a fever or notice any signs of infection in your incision area. This information is not intended to replace advice given to you by your health care provider. Make sure you discuss any questions you have with your health care provider. Document Revised: 06/01/2019 Document Reviewed: 06/01/2019 Elsevier Patient Education  [place] After This sheet gives you information about how to care for yourself after your procedure. Your health care provider may also give you more specific instructions. If you have problems or questions, contact your health care provider. What can I expect after the procedure? After the procedure, it is common to have: Tiredness. Forgetfulness about what happened after the procedure. Impaired judgment for important decisions. Nausea or vomiting. Some difficulty with balance. Follow these instructions at home: For the time period you were told by your health care provider:     Rest as needed. Do not participate in activities where you could fall or become injured. Do not drive or use machinery. Do not drink alcohol. Do not take sleeping pills or medicines that cause drowsiness. Do not make important decisions or sign legal  documents. Do not take care of children on your own. Eating and drinking Follow the diet that is recommended by your health care provider. Drink enough fluid to keep your urine pale yellow. If you vomit: Drink water, juice, or soup when you can drink without vomiting. Make sure you have little or no nausea before eating solid foods. General instructions Have a responsible adult stay with you for the time you are told. It is important to have someone help care for you until you are awake and alert. Take over-the-counter and prescription medicines only as told by your health care provider. If you have sleep apnea, surgery and certain medicines can increase your risk for breathing problems. Follow instructions from your health care provider about wearing your sleep device: Anytime you are sleeping, including during daytime naps. While taking prescription pain medicines, sleeping medicines, or medicines that make you drowsy. Avoid smoking. Keep all follow-up visits as told by your health care provider. This is important. Contact a health care provider if: You keep feeling nauseous or you keep vomiting. You feel light-headed. You are still sleepy or having trouble with balance after 24 hours. You develop a rash. You have a fever. You have redness or swelling around the IV site. Get help right away if: You have trouble breathing. You have new-onset confusion at home. Summary For several hours after your procedure, you may feel tired. You may also be forgetful and have poor judgment. Have a responsible adult stay with you for the time you are told. It is important to have someone help care for you until you are awake and alert. Rest as told. Do not drive or operate machinery. Do not drink alcohol or take sleeping pills. Get help right away if you have trouble breathing, or if you suddenly become confused. This information is not intended to replace advice given to you by your health care  provider. Make sure you discuss any questions you have with your health care provider. Document Revised: 12/31/2020 Document Reviewed: 12/29/2018 Elsevier Patient Education  [place]. How to Use Chlorhexidine Before Surgery Chlorhexidine gluconate (CHG) is a germ-killing (antiseptic) solution that is used to clean the skin. It can get rid of the bacteria that normally live on the skin and can keep them away for  about 24 hours. To clean your skin with CHG, you may be given: A CHG solution to use in the shower or as part of a sponge bath. A prepackaged cloth that contains CHG. Cleaning your skin with CHG may help lower the risk for infection: While you are staying in the intensive care unit of the hospital. If you have a vascular access, such as a central line, to provide short-term or long-term access to your veins. If you have a catheter to drain urine from your bladder. If you are on a ventilator. A ventilator is a machine that helps you breathe by moving air in and out of your lungs. After surgery. What are the risks? Risks of using CHG include: A skin reaction. Hearing loss, if CHG gets in your ears and you have a perforated eardrum. Eye injury, if CHG gets in your eyes and is not rinsed out. The CHG product catching fire. Make sure that you avoid smoking and flames after applying CHG to your skin. Do not use CHG: If you have a chlorhexidine allergy or have previously reacted to chlorhexidine. On babies younger than 37 months of age. How to use CHG solution Use CHG only as told by your health care provider, and follow the instructions on the label. Use the full amount of CHG as directed. Usually, this is one bottle. During a shower Follow these steps when using CHG solution during a shower (unless your health care provider gives you different instructions): Start the shower. Use your normal soap and shampoo to wash your face and hair. Turn off the shower or move out of the  shower stream. Pour the CHG onto a clean washcloth. Do not use any type of brush or rough-edged sponge. Starting at your neck, lather your body down to your toes. Make sure you follow these instructions: If you will be having surgery, pay special attention to the part of your body where you will be having surgery. Scrub this area for at least 1 minute. Do not use CHG on your head or face. If the solution gets into your ears or eyes, rinse them well with water. Avoid your genital area. Avoid any areas of skin that have broken skin, cuts, or scrapes. Scrub your back and under your arms. Make sure to wash skin folds. Let the lather sit on your skin for 1-2 minutes or as long as told by your health care provider. Thoroughly rinse your entire body in the shower. Make sure that all body creases and crevices are rinsed well. Dry off with a clean towel. Do not put any substances on your body afterward--such as powder, lotion, or perfume--unless you are told to do so by your health care provider. Only use lotions that are recommended by the manufacturer. Put on clean clothes or pajamas. If it is the night before your surgery, sleep in clean sheets.  During a sponge bath Follow these steps when using CHG solution during a sponge bath (unless your health care provider gives you different instructions): Use your normal soap and shampoo to wash your face and hair. Pour the CHG onto a clean washcloth. Starting at your neck, lather your body down to your toes. Make sure you follow these instructions: If you will be having surgery, pay special attention to the part of your body where you will be having surgery. Scrub this area for at least 1 minute. Do not use CHG on your head or face. If the solution gets into your ears or  eyes, rinse them well with water. Avoid your genital area. Avoid any areas of skin that have broken skin, cuts, or scrapes. Scrub your back and under your arms. Make sure to wash skin  folds. Let the lather sit on your skin for 1-2 minutes or as long as told by your health care provider. Using a different clean, wet washcloth, thoroughly rinse your entire body. Make sure that all body creases and crevices are rinsed well. Dry off with a clean towel. Do not put any substances on your body afterward--such as powder, lotion, or perfume--unless you are told to do so by your health care provider. Only use lotions that are recommended by the manufacturer. Put on clean clothes or pajamas. If it is the night before your surgery, sleep in clean sheets. How to use CHG prepackaged cloths Only use CHG cloths as told by your health care provider, and follow the instructions on the label. Use the CHG cloth on clean, dry skin. Do not use the CHG cloth on your head or face unless your health care provider tells you to. When washing with the CHG cloth: Avoid your genital area. Avoid any areas of skin that have broken skin, cuts, or scrapes. Before surgery Follow these steps when using a CHG cloth to clean before surgery (unless your health care provider gives you different instructions): Using the CHG cloth, vigorously scrub the part of your body where you will be having surgery. Scrub using a back-and-forth motion for 3 minutes. The area on your body should be completely wet with CHG when you are done scrubbing. Do not rinse. Discard the cloth and let the area air-dry. Do not put any substances on the area afterward, such as powder, lotion, or perfume. Put on clean clothes or pajamas. If it is the night before your surgery, sleep in clean sheets.  For general bathing Follow these steps when using CHG cloths for general bathing (unless your health care provider gives you different instructions). Use a separate CHG cloth for each area of your body. Make sure you wash between any folds of skin and between your fingers and toes. Wash your body in the following order, switching to a new cloth  after each step: The front of your neck, shoulders, and chest. Both of your arms, under your arms, and your hands. Your stomach and groin area, avoiding the genitals. Your right leg and foot. Your left leg and foot. The back of your neck, your back, and your buttocks. Do not rinse. Discard the cloth and let the area air-dry. Do not put any substances on your body afterward--such as powder, lotion, or perfume--unless you are told to do so by your health care provider. Only use lotions that are recommended by the manufacturer. Put on clean clothes or pajamas. Contact a health care provider if: Your skin gets irritated after scrubbing. You have questions about using your solution or cloth. You swallow any chlorhexidine. Call your local poison control center (1-417-156-2407 in the [place] Get help right away if: Your eyes itch badly, or they become very red or swollen. Your skin itches badly and is red or swollen. Your hearing changes. You have trouble seeing. You have swelling or tingling in your mouth or throat. You have trouble breathing. These symptoms may represent a serious problem that is an emergency. Do not wait to see if the symptoms will go away. Get medical help right away. Call your local emergency services (911 in the [place] Do not drive yourself  to the hospital. Summary Chlorhexidine gluconate (CHG) is a germ-killing (antiseptic) solution that is used to clean the skin. Cleaning your skin with CHG may help to lower your risk for infection. You may be given CHG to use for bathing. It may be in a bottle or in a prepackaged cloth to use on your skin. Carefully follow your health care provider's instructions and the instructions on the product label. Do not use CHG if you have a chlorhexidine allergy. Contact your health care provider if your skin gets irritated after scrubbing. This information is not intended to replace advice given to you by your health care provider. Make sure you  discuss any questions you have with your health care provider. Document Revised: 05/26/2021 Document Reviewed: 04/08/2020 Elsevier Patient Education  [place].

## 2021-10-27 ENCOUNTER — Encounter (HOSPITAL_COMMUNITY)
Admission: RE | Admit: 2021-10-27 | Discharge: 2021-10-27 | Disposition: A | Discharge status: Home / Self Care | Payer: Medicare PPO | Source: Ambulatory Visit | Attending: Orthopedic Surgery | Admitting: Orthopedic Surgery

## 2021-10-27 ENCOUNTER — Encounter (HOSPITAL_COMMUNITY): Payer: Self-pay

## 2021-10-27 DIAGNOSIS — G5601 Carpal tunnel syndrome, right upper limb: Secondary | ICD-10-CM | POA: Insufficient documentation

## 2021-10-27 DIAGNOSIS — I10 Essential (primary) hypertension: Secondary | ICD-10-CM | POA: Diagnosis not present

## 2021-10-27 DIAGNOSIS — Z01818 Encounter for other preprocedural examination: Secondary | ICD-10-CM | POA: Diagnosis not present

## 2021-10-27 HISTORY — DX: Nontoxic single thyroid nodule: E04.1

## 2021-10-27 LAB — CBC WITH DIFFERENTIAL/PLATELET
Abs Immature Granulocytes: 0.01 10*3/uL (ref 0.00–0.07)
Basophils Absolute: 0 10*3/uL (ref 0.0–0.1)
Basophils Relative: 1 %
Eosinophils Absolute: 0.1 10*3/uL (ref 0.0–0.5)
Eosinophils Relative: 3 %
HCT: 29.1 % — ABNORMAL LOW (ref 39.0–52.0)
Hemoglobin: 10.3 g/dL — ABNORMAL LOW (ref 13.0–17.0)
Immature Granulocytes: 0 %
Lymphocytes Relative: 38 %
Lymphs Abs: 1.4 10*3/uL (ref 0.7–4.0)
MCH: 40.4 pg — ABNORMAL HIGH (ref 26.0–34.0)
MCHC: 35.4 g/dL (ref 30.0–36.0)
MCV: 114.1 fL — ABNORMAL HIGH (ref 80.0–100.0)
Monocytes Absolute: 0.5 10*3/uL (ref 0.1–1.0)
Monocytes Relative: 13 %
Neutro Abs: 1.7 10*3/uL (ref 1.7–7.7)
Neutrophils Relative %: 45 %
Platelets: 244 10*3/uL (ref 150–400)
RBC: 2.55 MIL/uL — ABNORMAL LOW (ref 4.22–5.81)
RDW: 15 % (ref 11.5–15.5)
WBC: 3.7 10*3/uL — ABNORMAL LOW (ref 4.0–10.5)
nRBC: 0 % (ref 0.0–0.2)

## 2021-10-27 LAB — BASIC METABOLIC PANEL
Anion gap: 6 (ref 5–15)
BUN: 17 mg/dL (ref 8–23)
CO2: 24 mmol/L (ref 22–32)
Calcium: 9.1 mg/dL (ref 8.9–10.3)
Chloride: 107 mmol/L (ref 98–111)
Creatinine, Ser: 0.75 mg/dL (ref 0.61–1.24)
GFR, Estimated: >60 mL/min (ref >60)
Glucose, Bld: 114 mg/dL — ABNORMAL HIGH (ref 70–99)
Potassium: 4.5 mmol/L (ref 3.5–5.1)
Sodium: 137 mmol/L (ref 135–145)

## 2021-10-27 NOTE — H&P (Signed)
Randy Pena is an 81 y.o. male.    Outpatient surgery admission carpal tunnel release Chief Complaint  Patient presents with   Wrist Pain      Bilateral-right worse than left    History of present illness  81 year old male referred to me for possible carpal tunnel release.  The patient wanted to proceed with surgery and no longer participate in a nonoperative treatment program Complain of pain paresthesias thumb index and long finger with burning tingling numbness   Dr. 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 The lesion is characterized by sensory and motor demyelination with evidence of significant axonal injury.    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    There is no significant electrodiagnostic evidence of any other focal nerve entrapment, brachial plexopathy or cervical radiculopathy.        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   Spinal stenosis    Thyroid nodule    being monitored by Dr Benjamine Mola    Past Surgical History:  Procedure Laterality Date   CHEST WALL BIOPSY     melanoma-[facility]   EYE SURGERY     Laser surgery   JOINT REPLACEMENT     total right knee replacement today   TOTAL KNEE ARTHROPLASTY  03/07/2012   Procedure: TOTAL KNEE ARTHROPLASTY;  Surgeon: Carole Civil, MD;  Location: [facility];  Service: Orthopedics;  Laterality: Right;  depuy    Family History  Problem Relation Age of Onset   Heart disease Other     Arthritis Other    Cancer Other    Diabetes Other    Social History:  reports that he has never smoked. He has never used smokeless tobacco. He reports current alcohol use. He reports that he does not use drugs.  Allergies:  Allergies  Allergen Reactions   Statins     Muscle pain and stiffness    No medications prior to admission.    Results for orders placed or performed during the hospital encounter of 10/27/21 (from the past 48 hour(s))  CBC WITH DIFFERENTIAL     Status: Abnormal   Collection Time: 10/27/21 11:33 AM  Result Value Ref Range   WBC 3.7 (L) 4.0 - 10.5 K/uL   RBC 2.55 (L) 4.22 - 5.81 MIL/uL   Hemoglobin 10.3 (L) 13.0 - 17.0 g/dL   HCT 29.1 (L) 39.0 - 52.0 %   MCV 114.1 (H) 80.0 - 100.0 fL   MCH 40.4 (H) 26.0 - 34.0 pg   MCHC 35.4 30.0 - 36.0 g/dL   RDW 15.0 11.5 - 15.5 %   Platelets 244 150 - 400 K/uL   nRBC 0.0 0.0 - 0.2 %   Neutrophils Relative % 45 %   Neutro Abs 1.7 1.7 - 7.7 K/uL   Lymphocytes Relative 38 %   Lymphs Abs 1.4 0.7 - 4.0 K/uL   Monocytes Relative 13 %   Monocytes Absolute 0.5 0.1 - 1.0 K/uL   Eosinophils Relative 3 %  Eosinophils Absolute 0.1 0.0 - 0.5 K/uL   Basophils Relative 1 %   Basophils Absolute 0.0 0.0 - 0.1 K/uL   Immature Granulocytes 0 %   Abs Immature Granulocytes 0.01 0.00 - 0.07 K/uL    Comment: Performed at [facility], [address], [place], [place] [address]  Basic metabolic panel     Status: Abnormal   Collection Time: 10/27/21 11:33 AM  Result Value Ref Range   Sodium 137 135 - 145 mmol/L   Potassium 4.5 3.5 - 5.1 mmol/L   Chloride 107 98 - 111 mmol/L   CO2 24 22 - 32 mmol/L   Glucose, Bld 114 (H) 70 - 99 mg/dL    Comment: Glucose reference range applies only to samples taken after fasting for at least 8 hours.   BUN 17 8 - 23 mg/dL   Creatinine, Ser 0.75 0.61 - 1.24 mg/dL   Calcium 9.1 8.9 - 10.3 mg/dL   GFR, Estimated >60 >60 mL/min    Comment: (NOTE) Calculated using the CKD-EPI Creatinine Equation  (2021)    Anion gap 6 5 - 15    Comment: Performed at [facility], [address] [place], [place] [address]   No results found.  Review of Systems  There were no vitals taken for this visit. Physical Exam   General appearance no gross abnormalities he is awake alert and oriented x3 mood and affect are normal.  His cardiovascular exam shows normal pulse and perfusion normal color and capillary refill of his hands and upper extremities with good radial and ulnar pulse  He has decreased sensation in both median nerves right and left with no history or evidence of atrophy his grip strength seems equal and normal.  He has positive symptoms and signs of carpal tunnel which include decreased sensation in the median nerve distribution, pressure or exhibits pain over the carpal tunnels Tinel's was negative Phalen's test was positive    Assessment/Plan Bilateral carpal tunnel syndrome  Right carpal tunnel release with open technique  [name], MD 10/27/2021, 5:39 PM

## 2021-10-28 ENCOUNTER — Ambulatory Visit (HOSPITAL_COMMUNITY): Payer: Medicare PPO | Admitting: Certified Registered Nurse Anesthetist

## 2021-10-28 ENCOUNTER — Ambulatory Visit (HOSPITAL_BASED_OUTPATIENT_CLINIC_OR_DEPARTMENT_OTHER): Payer: Medicare PPO | Admitting: Certified Registered Nurse Anesthetist

## 2021-10-28 ENCOUNTER — Encounter (HOSPITAL_COMMUNITY)
Admission: RE | Disposition: A | Discharge status: Home / Self Care | Payer: Self-pay | Source: Home / Self Care | Attending: Orthopedic Surgery

## 2021-10-28 ENCOUNTER — Encounter (HOSPITAL_COMMUNITY): Payer: Self-pay | Admitting: Orthopedic Surgery

## 2021-10-28 ENCOUNTER — Ambulatory Visit (HOSPITAL_COMMUNITY)
Admission: RE | Admit: 2021-10-28 | Discharge: 2021-10-28 | Disposition: A | Discharge status: Home / Self Care | Payer: Medicare PPO | Attending: Orthopedic Surgery | Admitting: Orthopedic Surgery

## 2021-10-28 DIAGNOSIS — G5601 Carpal tunnel syndrome, right upper limb: Secondary | ICD-10-CM | POA: Diagnosis present

## 2021-10-28 HISTORY — PX: CARPAL TUNNEL RELEASE: SHX101

## 2021-10-28 SURGERY — CARPAL TUNNEL RELEASE
Anesthesia: General | Site: Hand | Laterality: Right

## 2021-10-28 MED ORDER — FENTANYL CITRATE PF 50 MCG/ML IJ SOSY: 25.0000 ug | PREFILLED_SYRINGE | INTRAMUSCULAR | Status: DC | PRN

## 2021-10-28 MED ORDER — LACTATED RINGERS IV SOLN: INTRAVENOUS | Status: DC

## 2021-10-28 MED ORDER — OXYCODONE HCL 5 MG PO TABS: 5.0000 mg | ORAL_TABLET | Freq: Once | ORAL | Status: DC | PRN

## 2021-10-28 MED ORDER — ONDANSETRON HCL 4 MG/2ML IJ SOLN: 4.0000 mg | Freq: Once | INTRAMUSCULAR | Status: DC | PRN

## 2021-10-28 MED ORDER — LIDOCAINE HCL (PF) 1 % IJ SOLN
INTRAMUSCULAR | Status: AC
  Filled 2021-10-28: qty 30

## 2021-10-28 MED ORDER — OXYCODONE HCL 5 MG/5ML PO SOLN: 5.0000 mg | Freq: Once | ORAL | Status: DC | PRN

## 2021-10-28 MED ORDER — BUPIVACAINE HCL (PF) 0.5 % IJ SOLN
INTRAMUSCULAR | Status: AC
  Filled 2021-10-28: qty 30

## 2021-10-28 MED ORDER — FENTANYL CITRATE (PF) 100 MCG/2ML IJ SOLN
INTRAMUSCULAR | Status: AC
  Filled 2021-10-28: qty 2

## 2021-10-28 MED ORDER — LIDOCAINE HCL (PF) 1 % IJ SOLN
INTRAMUSCULAR | Status: DC | PRN
  Administered 2021-10-28: 10 mL

## 2021-10-28 MED ORDER — PROPOFOL 10 MG/ML IV BOLUS
INTRAVENOUS | Status: AC
  Filled 2021-10-28: qty 20

## 2021-10-28 MED ORDER — HYDROCODONE-ACETAMINOPHEN 5-325 MG PO TABS: 1.0000 | ORAL_TABLET | ORAL | 0 refills | Status: DC | PRN

## 2021-10-28 MED ORDER — ORAL CARE MOUTH RINSE: 15.0000 mL | Freq: Once | OROMUCOSAL | Status: AC

## 2021-10-28 MED ORDER — KETAMINE HCL 10 MG/ML IJ SOLN
INTRAMUSCULAR | Status: DC | PRN
  Administered 2021-10-28 (×3): 10 mg via INTRAVENOUS

## 2021-10-28 MED ORDER — CHLORHEXIDINE GLUCONATE 0.12 % MT SOLN
15.0000 mL | Freq: Once | OROMUCOSAL | Status: AC
  Administered 2021-10-28: 15 mL via OROMUCOSAL

## 2021-10-28 MED ORDER — PROPOFOL 500 MG/50ML IV EMUL
INTRAVENOUS | Status: DC | PRN
  Administered 2021-10-28: 75 ug/kg/min via INTRAVENOUS

## 2021-10-28 MED ORDER — CEFAZOLIN SODIUM-DEXTROSE 2-4 GM/100ML-% IV SOLN
2.0000 g | INTRAVENOUS | Status: AC
  Administered 2021-10-28: 2 g via INTRAVENOUS
  Filled 2021-10-28: qty 100

## 2021-10-28 MED ORDER — PROPOFOL 10 MG/ML IV BOLUS
INTRAVENOUS | Status: DC | PRN
  Administered 2021-10-28: 20 mg via INTRAVENOUS

## 2021-10-28 MED ORDER — KETAMINE HCL 50 MG/5ML IJ SOSY
PREFILLED_SYRINGE | INTRAMUSCULAR | Status: AC
  Filled 2021-10-28: qty 5

## 2021-10-28 MED ORDER — 0.9 % SODIUM CHLORIDE (POUR BTL) OPTIME
TOPICAL | Status: DC | PRN
  Administered 2021-10-28: 1000 mL

## 2021-10-28 MED ORDER — BUPIVACAINE HCL (PF) 0.5 % IJ SOLN
INTRAMUSCULAR | Status: DC | PRN
  Administered 2021-10-28: 10 mL

## 2021-10-28 SURGICAL SUPPLY — 39 items
APL PRP STRL LF DISP 70% ISPRP (MISCELLANEOUS) ×1
BANDAGE ESMARK 4X12 BL STRL LF (DISPOSABLE) ×2 IMPLANT
BLADE SURG 15 STRL LF DISP TIS (BLADE) ×2 IMPLANT
BLADE SURG 15 STRL SS (BLADE) ×1
BNDG CMPR 12X4 ELC STRL LF (DISPOSABLE) ×1
BNDG CMPR 5X4 CHSV STRCH STRL (GAUZE/BANDAGES/DRESSINGS) ×1
BNDG CMPR STD VLCR NS LF 5.8X3 (GAUZE/BANDAGES/DRESSINGS) ×1
BNDG COHESIVE 4X5 TAN STRL LF (GAUZE/BANDAGES/DRESSINGS) IMPLANT
BNDG ELASTIC 3X5.8 VLCR NS LF (GAUZE/BANDAGES/DRESSINGS) ×2 IMPLANT
BNDG ESMARK 4X12 BLUE STRL LF (DISPOSABLE) ×1
BNDG GAUZE DERMACEA FLUFF 4 (GAUZE/BANDAGES/DRESSINGS) IMPLANT
BNDG GZE DERMACEA 4 6PLY (GAUZE/BANDAGES/DRESSINGS) ×1
CHLORAPREP W/TINT 26 (MISCELLANEOUS) ×2 IMPLANT
CLOTH BEACON ORANGE TIMEOUT ST (SAFETY) ×2 IMPLANT
COVER LIGHT HANDLE STERIS (MISCELLANEOUS) ×4 IMPLANT
CUFF TOURN SGL QUICK 18X4 (TOURNIQUET CUFF) ×2 IMPLANT
DRAPE HALF SHEET 40X57 (DRAPES) IMPLANT
ELECT NDL TIP 2.8 STRL (NEEDLE) IMPLANT
ELECT NEEDLE TIP 2.8 STRL (NEEDLE) ×1 IMPLANT
ELECT REM PT RETURN 9FT ADLT (ELECTROSURGICAL) ×1
ELECTRODE REM PT RTRN 9FT ADLT (ELECTROSURGICAL) ×2 IMPLANT
GAUZE SPONGE 4X4 12PLY STRL (GAUZE/BANDAGES/DRESSINGS) ×2 IMPLANT
GAUZE XEROFORM 1X8 LF (GAUZE/BANDAGES/DRESSINGS) ×2 IMPLANT
GLOVE BIO SURGEON STRL SZ7 (GLOVE) IMPLANT
GLOVE BIOGEL PI IND STRL 7.0 (GLOVE) ×4 IMPLANT
GLOVE SKINSENSE STRL SZ8.0 LF (GLOVE) IMPLANT
GOWN STRL REUS W/TWL LRG LVL3 (GOWN DISPOSABLE) ×2 IMPLANT
GOWN STRL REUS W/TWL XL LVL3 (GOWN DISPOSABLE) ×2 IMPLANT
KIT TURNOVER KIT A (KITS) ×2 IMPLANT
MANIFOLD NEPTUNE II (INSTRUMENTS) ×2 IMPLANT
NDL HYPO 21X1.5 SAFETY (NEEDLE) ×2 IMPLANT
NEEDLE HYPO 21X1.5 SAFETY (NEEDLE) ×1 IMPLANT
NS IRRIG 1000ML POUR BTL (IV SOLUTION) ×2 IMPLANT
PACK BASIC LIMB (CUSTOM PROCEDURE TRAY) ×2 IMPLANT
PAD ARMBOARD 7.5X6 YLW CONV (MISCELLANEOUS) ×2 IMPLANT
POSITIONER HAND ALUMI XLG (MISCELLANEOUS) ×2 IMPLANT
SET BASIN LINEN APH (SET/KITS/TRAYS/PACK) ×2 IMPLANT
SUT ETHILON 3 0 FSL (SUTURE) ×2 IMPLANT
SYR CONTROL 10ML LL (SYRINGE) ×2 IMPLANT

## 2021-10-28 NOTE — Transfer of Care (Signed)
Immediate Anesthesia Transfer of Care Note  Patient: Randy Pena  Procedure(s) Performed: CARPAL TUNNEL RELEASE (Right: Hand)  Patient Location: PACU  Anesthesia Type:General  Level of Consciousness: awake  Airway & Oxygen Therapy: Patient Spontanous Breathing  Post-op Assessment: Report given to RN and Post -op Vital signs reviewed and stable  Post vital signs: Reviewed and stable  Last Vitals:  Vitals Value Taken Time  BP 109/92 10/28/21 1019  Temp    Pulse 56 10/28/21 1021  Resp 17 10/28/21 1021  SpO2 99 % 10/28/21 1021  Vitals shown include unvalidated device data.  Last Pain:  Vitals:   10/28/21 0843  TempSrc: Oral  PainSc: 0-No pain      Patients Stated Pain Goal: 5 (03/79/55 8316)  Complications: No notable events documented.

## 2021-10-28 NOTE — Op Note (Signed)
Date 10/28/2021   PRE-OPERATIVE DIAGNOSIS:  RIGHT  CARPAL TUNNEL SYNDROME  POST-OPERATIVE DIAGNOSIS:  RIGHT CARPAL TUNNEL SYNDROME   FINDINGS: Compression discoloration and flattening of the median nerve with purple appearance.  Prior to the compression bulbous appearance of the nerve indicated longstanding disease and severe disease.   PROCEDURE:  Procedure(s): RIGHT CARPAL TUNNEL RELEASE RIGHT   SURGEON:  Surgeon(s) and Role:    * Carole Civil, MD - Primary  PHYSICIAN ASSISTANT:   ASSISTANTS: none   ANESTHESIA:   regional  BLOOD ADMINISTERED:none  DRAINS: none   LOCAL MEDICATIONS USED:  MARCAINE     SPECIMEN:  No Specimen  DISPOSITION OF SPECIMEN:  N/A  COUNTS:  YES  TOURNIQUET: 21 minutes at 250 mmHg DICTATION: .Dragon Dictation   Surgeon Aline Brochure  Operative findings compression of the RIGHT MEDIAN NERVE   Indications failure of conservative treatment to relieve pain and paresthesias and numbness and tingling of the RIGHT HAND   The patient was identified in the preop area we confirm the surgical site marked as right wrist. Chart update completed. Patient taken to surgery.  Antibiotic 2 g Ancef after establishing a successful anesthesia the arm was prepped with ChloraPrep  Timeout executed completed and confirmed site. RIGHT WRIST  /  HAND   A straight incision was made over the RIGHT carpal tunnel in line with the radial border of the ring finger. Blunt dissection was carried out to find the distal aspect of the carpal tunnel. A blunted judgment was passed beneath the carpal tunnel. Sharp incision was then used to release the transverse carpal ligament. The contents of the carpal tunnel were inspected. The median nerve was severely compressed discolored with a purple color was severely flattened with bulbous appearance proximal to the compression  The wound was irrigated and then closed with 3-0 nylon suture. We injected 10 mL of plain Marcaine on the  radial side of the incision  A sterile bandage was applied and the tourniquet was released the color of the hand and capillary refill were normal  The patient was taken to the recovery room in stable condition  PLAN OF CARE: Discharge to home after PACU  PATIENT DISPOSITION:  PACU - hemodynamically stable.   Delay start of Pharmacological VTE agent (>24hrs) due to surgical blood loss or risk of bleeding: not applicable

## 2021-10-28 NOTE — Brief Op Note (Signed)
10/28/2021  10:24 AM  PATIENT:  Jaclyn Prime  81 y.o. male  PRE-OPERATIVE DIAGNOSIS:  carpal tunnel syndrome right  POST-OPERATIVE DIAGNOSIS:  carpal tunnel syndrome right  PROCEDURE:  Procedure(s): CARPAL TUNNEL RELEASE (Right)  SURGEON:  Surgeon(s) and Role:    Carole Civil, MD - Primary  PHYSICIAN ASSISTANT:   ASSISTANTS: none   ANESTHESIA:   MAC  EBL:  NONE   BLOOD ADMINISTERED:none  DRAINS: none   LOCAL MEDICATIONS USED:  MARCAINE     SPECIMEN:  No Specimen  DISPOSITION OF SPECIMEN:  N/A  COUNTS:  YES  TOURNIQUET:   Total Tourniquet Time Documented: Forearm (Right) - 21 minutes Total: Forearm (Right) - 21 minutes   DICTATION: .Viviann Spare Dictation  PLAN OF CARE: Discharge to home after PACU  PATIENT DISPOSITION:  PACU - hemodynamically stable.   Delay start of Pharmacological VTE agent (>24hrs) due to surgical blood loss or risk of bleeding: not applicable

## 2021-10-28 NOTE — Anesthesia Preprocedure Evaluation (Signed)
Anesthesia Evaluation  Patient identified by MRN, date of birth, ID band Patient awake    Reviewed: Allergy & Precautions, H&P , NPO status , Patient's Chart, lab work & pertinent test results, reviewed documented beta blocker date and time   Airway Mallampati: II  TM Distance: >3 FB Neck ROM: full    Dental no notable dental hx.    Pulmonary neg pulmonary ROS,    Pulmonary exam normal breath sounds clear to auscultation       Cardiovascular Exercise Tolerance: Good hypertension, negative cardio ROS   Rhythm:regular Rate:Normal     Neuro/Psych negative neurological ROS  negative psych ROS   GI/Hepatic negative GI ROS, Neg liver ROS,   Endo/Other  negative endocrine ROS  Renal/GU negative Renal ROS  negative genitourinary   Musculoskeletal   Abdominal   Peds  Hematology negative hematology ROS (+)   Anesthesia Other Findings   Reproductive/Obstetrics negative OB ROS                             Anesthesia Physical Anesthesia Plan  ASA: 2  Anesthesia Plan: General   Post-op Pain Management:    Induction:   PONV Risk Score and Plan: Propofol infusion  Airway Management Planned:   Additional Equipment:   Intra-op Plan:   Post-operative Plan:   Informed Consent: I have reviewed the patients History and Physical, chart, labs and discussed the procedure including the risks, benefits and alternatives for the proposed anesthesia with the patient or authorized representative who has indicated his/her understanding and acceptance.     Dental Advisory Given  Plan Discussed with: CRNA  Anesthesia Plan Comments:         Anesthesia Quick Evaluation

## 2021-10-28 NOTE — Interval H&P Note (Signed)
History and Physical Interval Note:  10/28/2021 9:05 AM  Randy Pena  has presented today for surgery, with the diagnosis of carpal tunnel syndrome right.  The various methods of treatment have been discussed with the patient and family. After consideration of risks, benefits and other options for treatment, the patient has consented to  Procedure(s): CARPAL TUNNEL RELEASE (Right) as a surgical intervention.  The patient's history has been reviewed, patient examined, no change in status, stable for surgery.  I have reviewed the patient's chart and labs.  Questions were answered to the patient's satisfaction.     [name]

## 2021-10-28 NOTE — Anesthesia Postprocedure Evaluation (Signed)
Anesthesia Post Note  Patient: Randy Pena  Procedure(s) Performed: CARPAL TUNNEL RELEASE (Right: Hand)  Anesthesia Type: General Anesthetic complications: no   No notable events documented.   Last Vitals:  Vitals:   10/28/21 1030 10/28/21 1047  BP: (!) 136/58 130/84  Pulse: (!) 56 63  Resp: 18 17  Temp:  36.7 C  SpO2: 98% 99%    Last Pain:  Vitals:   10/28/21 1047  TempSrc: Oral  PainSc: 0-No pain                 Louann Sjogren

## 2021-11-04 ENCOUNTER — Encounter (HOSPITAL_COMMUNITY): Payer: Self-pay | Admitting: Orthopedic Surgery

## 2021-11-12 ENCOUNTER — Encounter: Payer: Self-pay | Admitting: Orthopedic Surgery

## 2021-11-12 ENCOUNTER — Ambulatory Visit (INDEPENDENT_AMBULATORY_CARE_PROVIDER_SITE_OTHER): Payer: Medicare PPO | Admitting: Orthopedic Surgery

## 2021-11-12 DIAGNOSIS — Z9889 Other specified postprocedural states: Secondary | ICD-10-CM

## 2021-11-12 NOTE — Progress Notes (Signed)
Chief Complaint  Patient presents with   Post-op Follow-up    Right hand carpal tunnel release 10/28/21 sutures removed without difficulty patient tolerated well.    POV 1  RT CTR   Severe compression noted at surgery   The incx looks good  The fingers are improved but still has some numbness  Rec ROM exercises and return 4 weeks   Expect long time to recover

## 2021-11-14 IMAGING — US US THYROID
1 series · 13 of 25 positions shown · non-contrast
Comparison: 12/28/2018

11/08/2018

CLINICAL DATA: Thyroid nodule follow-up

EXAM:
THYROID ULTRASOUND
TECHNIQUE: Ultrasound examination of the thyroid gland and adjacent soft
tissues was performed.

[Series 1: us thyroid · 13 of 70 slices shown]
[im 1/70]
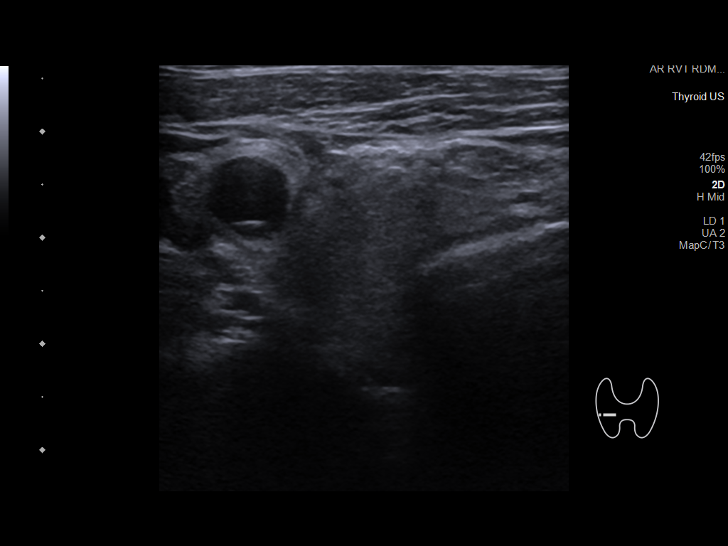
[im 6/70]
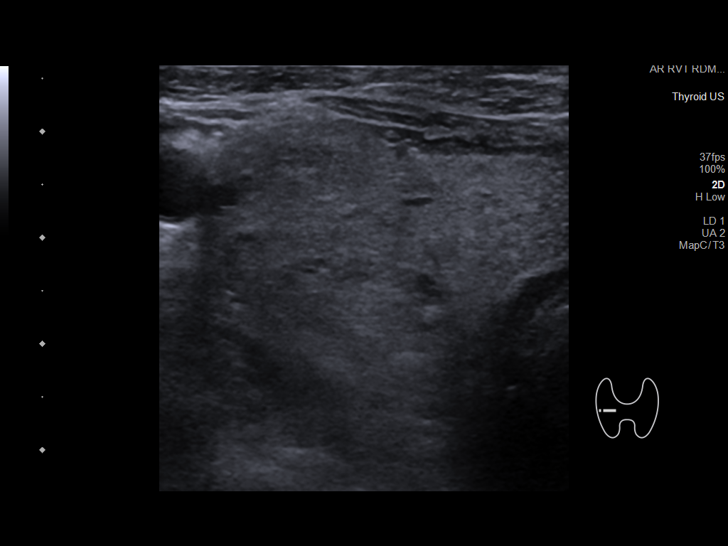
[im 12/70]
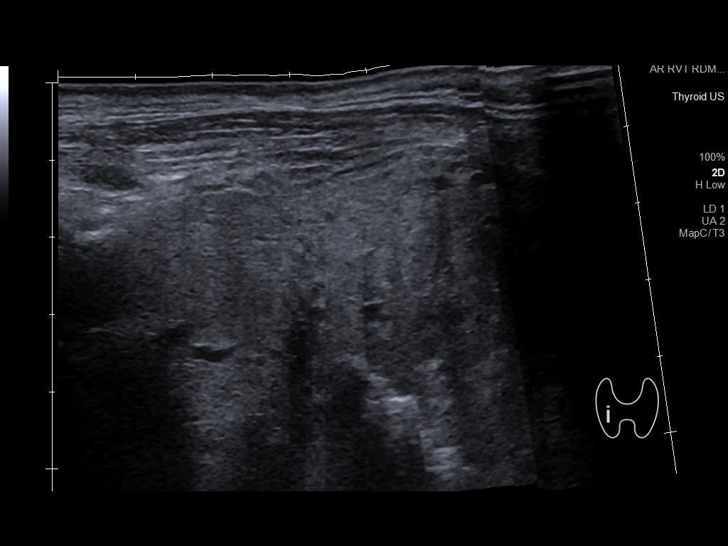
[im 18/70]
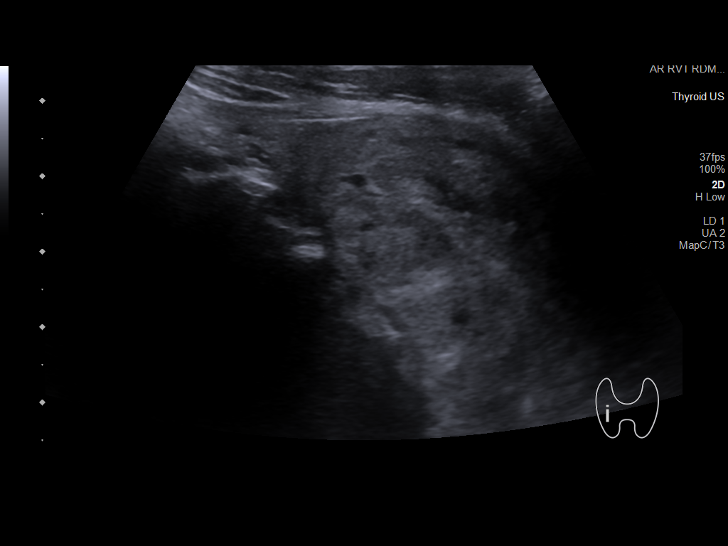
[im 24/70]
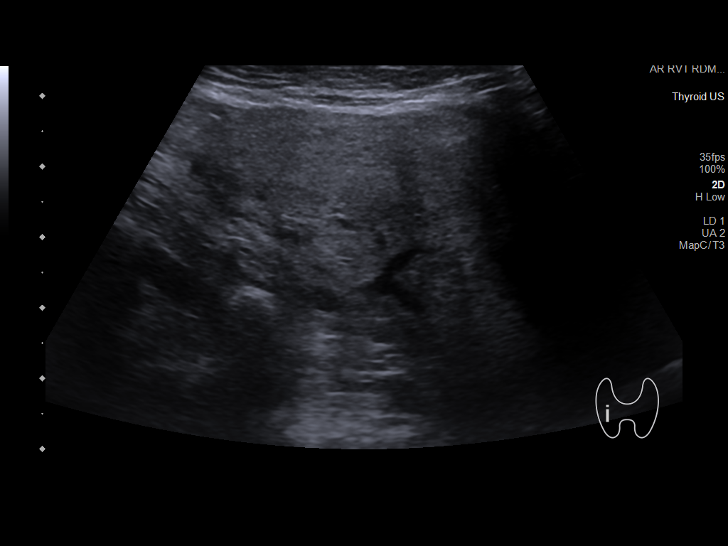
[im 29/70]
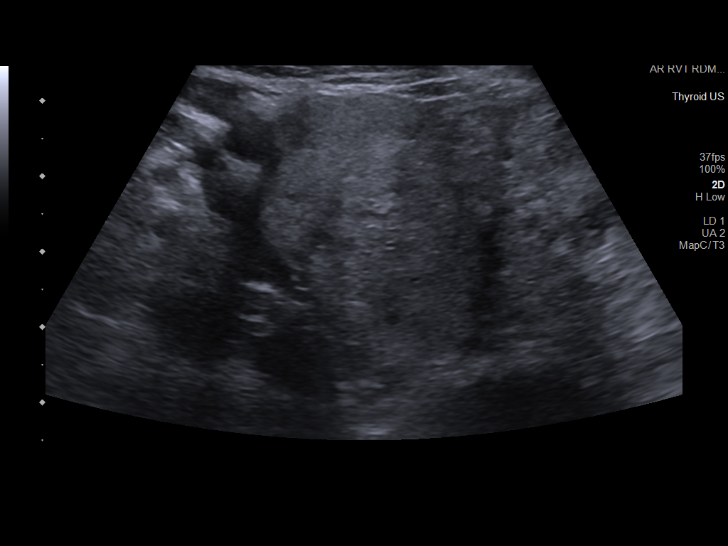
[im 35/70]
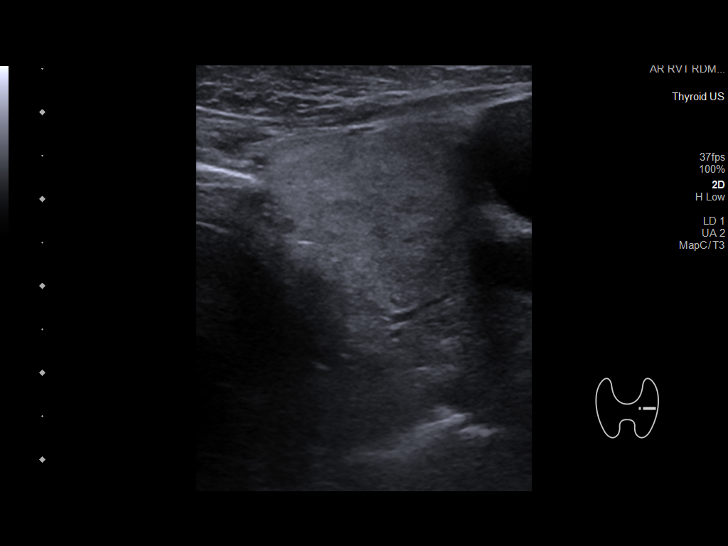
[im 41/70]
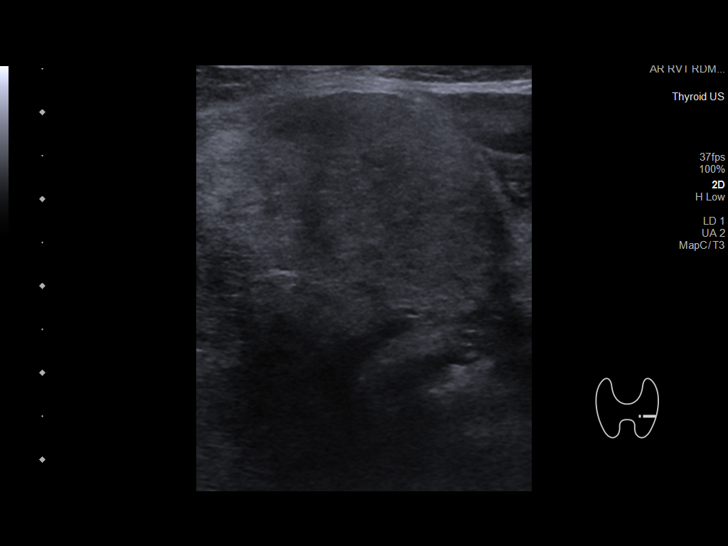
[im 47/70]
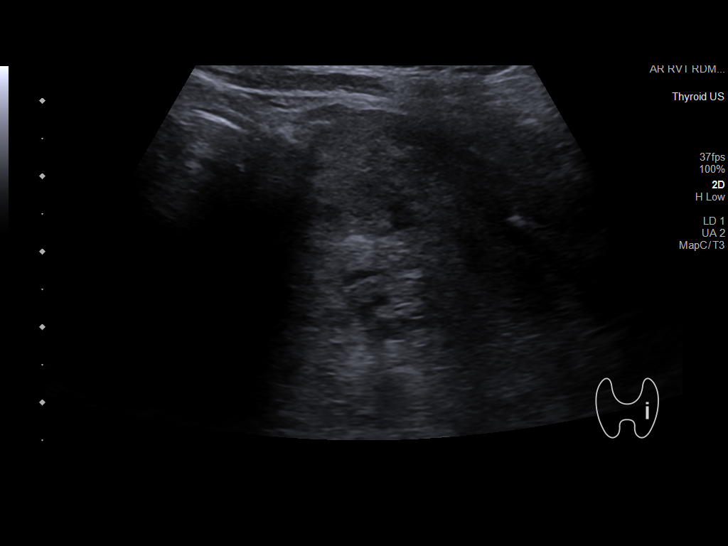
[im 52/70]
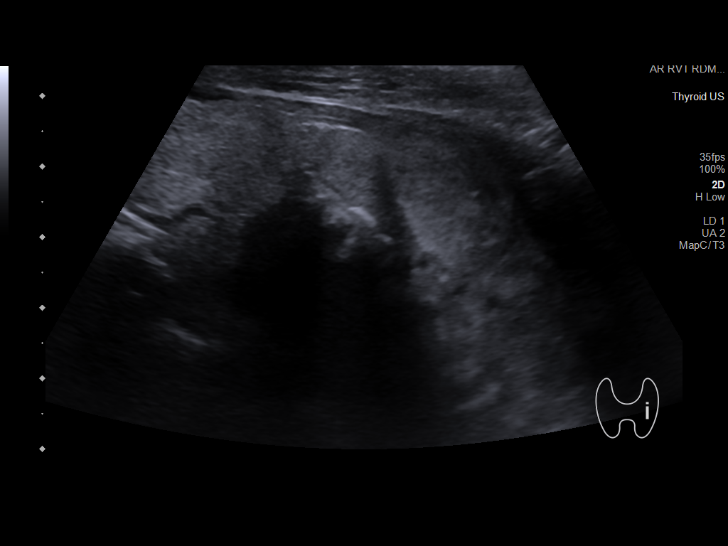
[im 58/70]
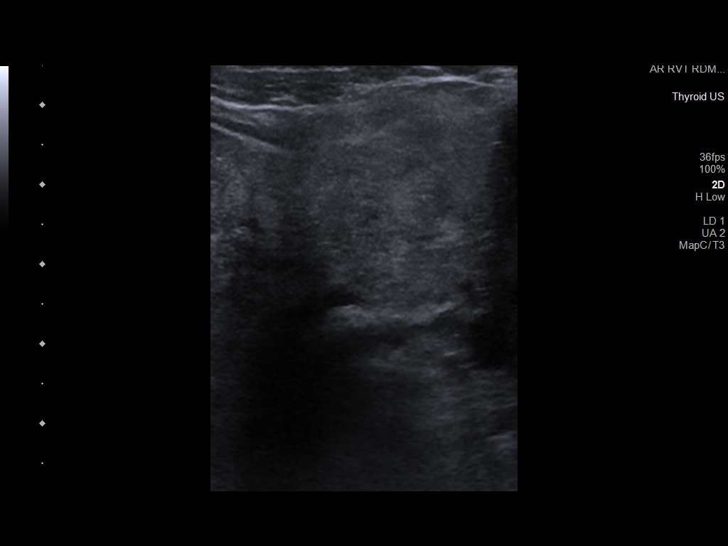
[im 64/70]
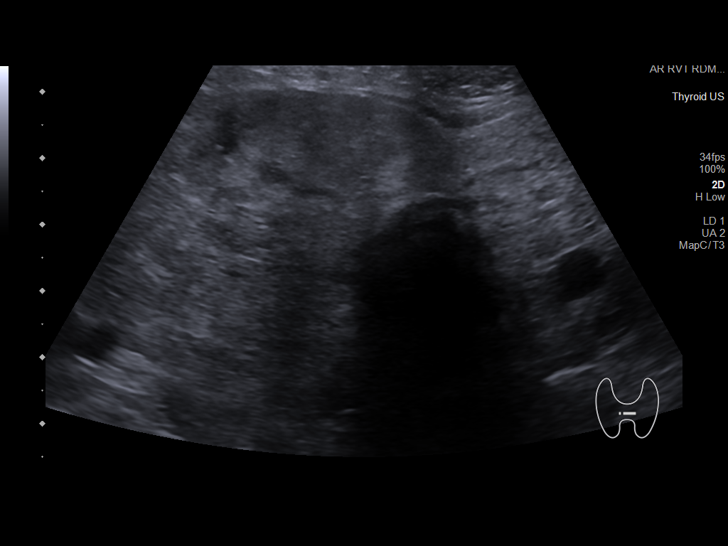
[im 70/70]
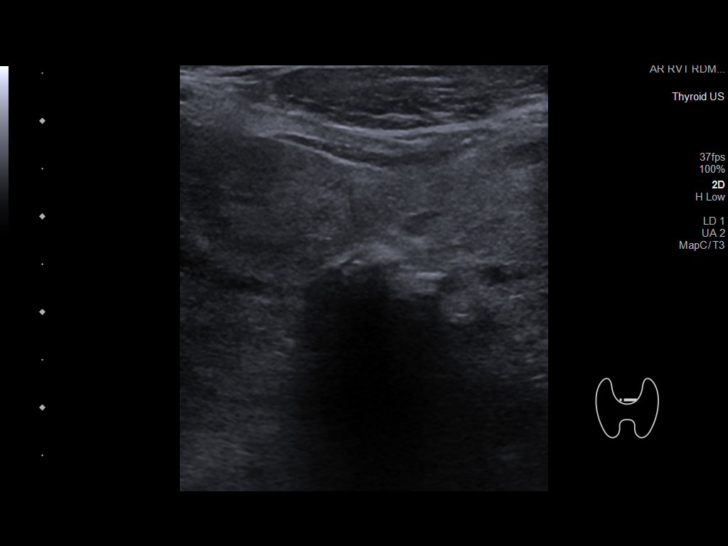

[13 of 25 positions shown; findings below may reference images not displayed]

FINDINGS: Parenchymal Echotexture: Mildly heterogeneous

Isthmus: 0.8 cm

Right lobe: 7.4 x 4.1 x 2.9 cm

Left lobe: 7.2 x 2.7 x 2.8 cm

_________________________________________________________

Estimated total number of nodules >/= 1 cm: 2

Number of spongiform nodules >/=  2 cm not described below (TR1): 0

Number of mixed cystic and solid nodules >/= 1.5 cm not described
below (TR2): 0

_________________________________________________________

Nodule 1: 5.2 x 4.4 x 3.7 cm solid isoechoic nodule in the inferior
right thyroid lobe was previously biopsied on 12/28/2018. It is not
significantly changed in size since prior examination. Please
correlate with prior FNA results.

_________________________________________________________

[DATE] x 1.9 x 2.0 cm isoechoic region in the inferior left thyroid
lobe favored to be a pseudo nodule given lack of defined margins.
IMPRESSION: Previously biopsied right inferior thyroid nodule is not
significantly changed in size. Please correlate with prior FNA
results.

The above is in keeping with the ACR TI-RADS recommendations - [HOSPITAL] 1375;[DATE].

## 2021-11-18 IMAGING — DX DG CHEST 1V PORT
2 series · 2 of 2 positions shown · non-contrast
Comparison: Overlapping portion of CT abdomen from 02/24/2018

CLINICAL DATA: Chest pain and shortness of breath starting upon
awakening this morning. History of hypertension.

EXAM:
PORTABLE CHEST 1 VIEW

[chest ap (1 of 2)]
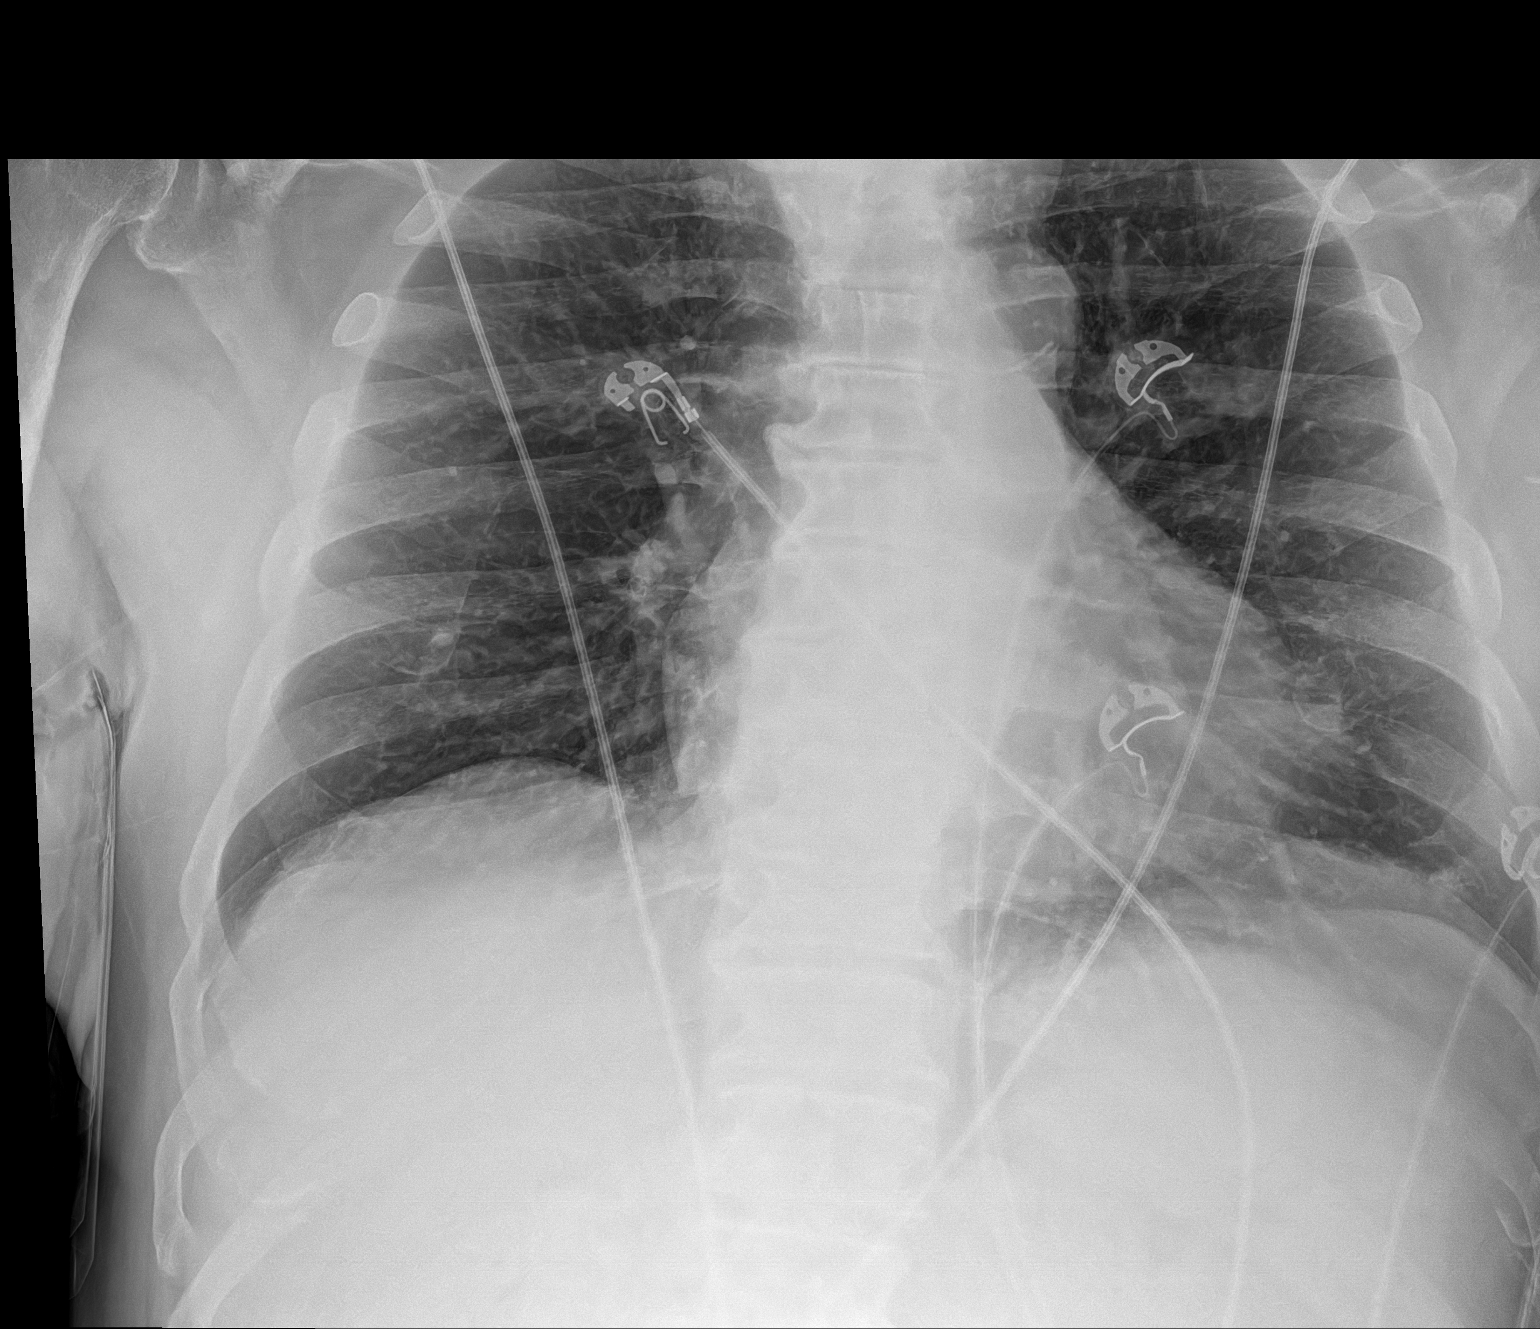

[chest ap (2 of 2)]
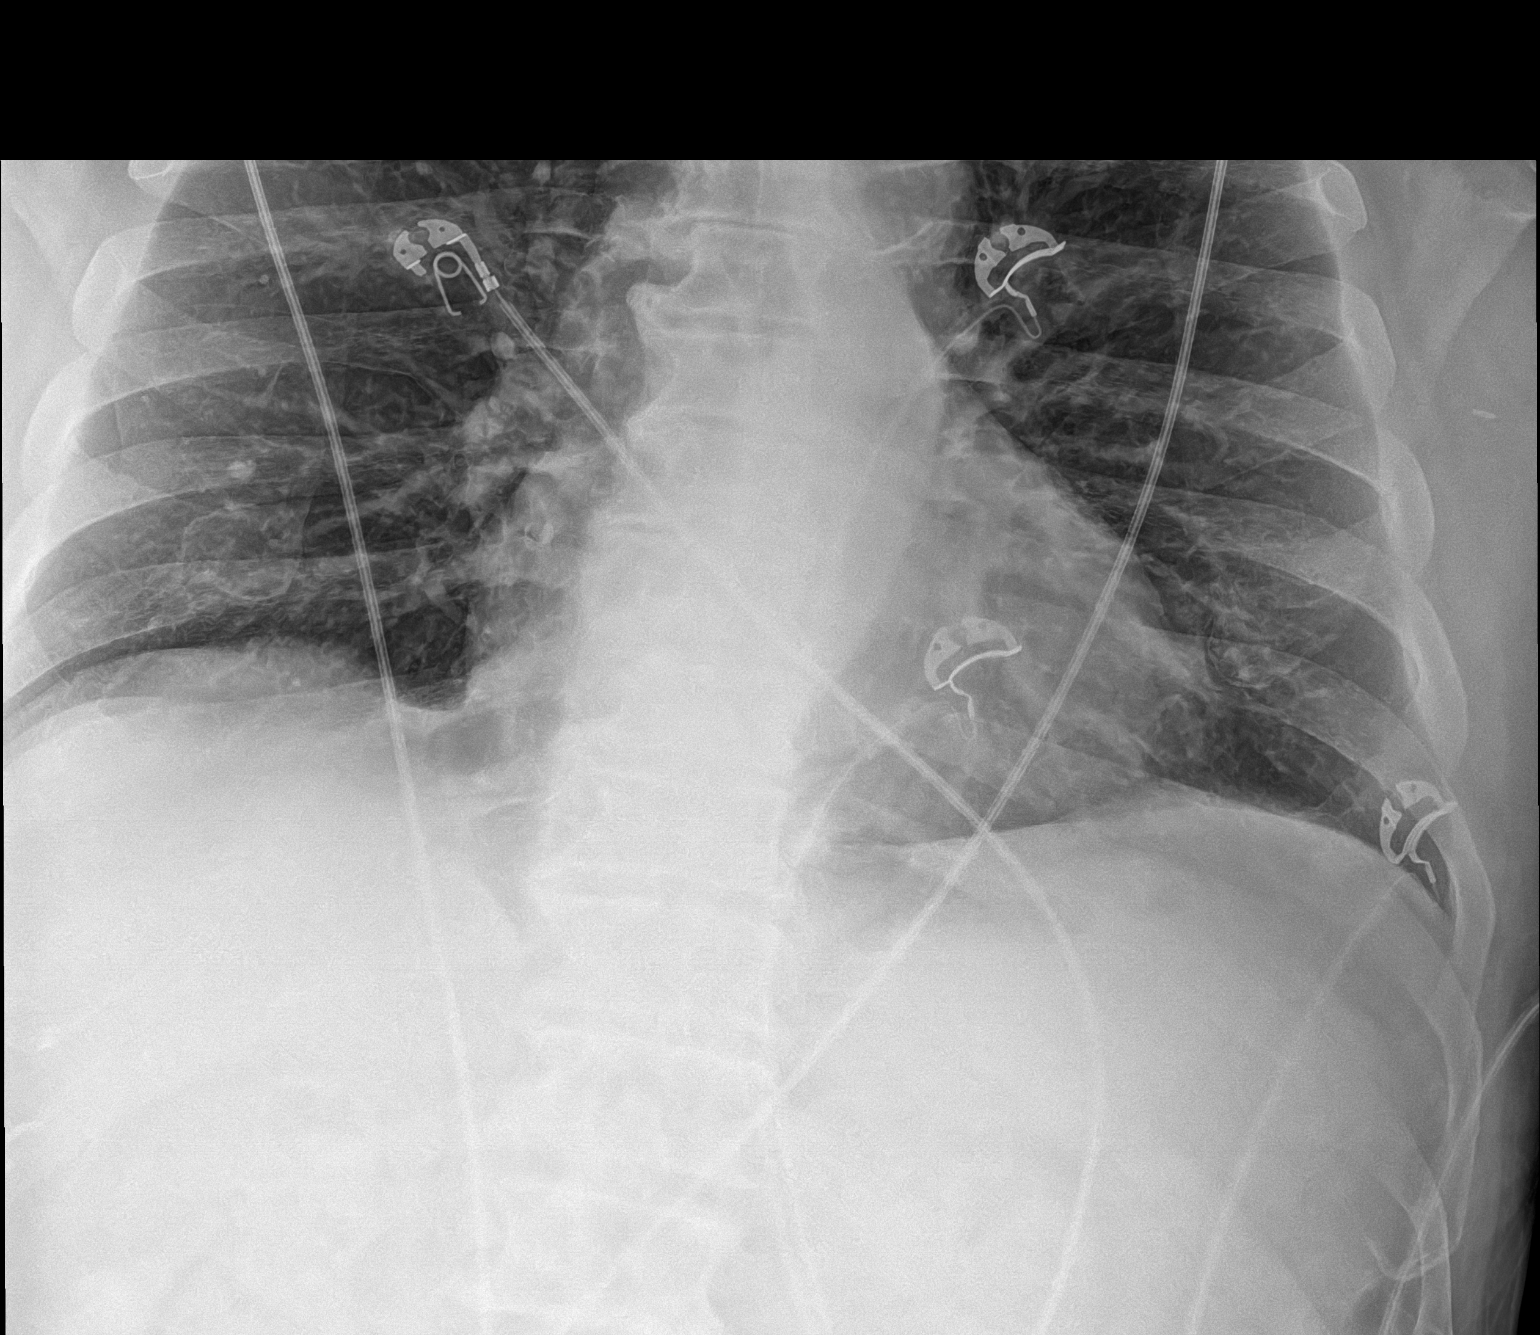

[2 of 2 positions shown; findings below may reference images not displayed]

FINDINGS: Apical lordotic projection of the chest noted. Atherosclerotic
calcification of the aortic arch. Thoracic spondylosis. Heart size
within normal limits for projection. 5 by 3 mm calcific density at
the right lung base is likely a granuloma.
IMPRESSION: 1. No specific cause for the patient's symptoms is identified.
2.  Aortic Atherosclerosis ([id number]-X19.9).
3. Thoracic spondylosis.
4. Suspected small calcified granuloma at the right lung base.

## 2021-12-09 NOTE — Progress Notes (Unsigned)
Randy Pena had a carpal tunnel release on September 19 this is his 6-week postop visit  He had the right carpal tunnel released  Chief Complaint  Patient presents with   Post-op Follow-up    Right carpal tunnel release 10/28/21    [place] continues to improve.  He has some residual lack of feeling in the tips of his index and long finger he has more feeling return in the thumb.  His wound healed nicely.  He has full range of motion.  He is no longer dropping things and has no night pain  He would like to get his left carpal tunnel release done in January so I will see him approximately mid December to schedule the surgery

## 2021-12-10 ENCOUNTER — Encounter: Payer: Self-pay | Admitting: Orthopedic Surgery

## 2021-12-10 ENCOUNTER — Ambulatory Visit (INDEPENDENT_AMBULATORY_CARE_PROVIDER_SITE_OTHER): Payer: Medicare PPO | Admitting: Orthopedic Surgery

## 2021-12-10 DIAGNOSIS — Z9889 Other specified postprocedural states: Secondary | ICD-10-CM

## 2022-01-21 ENCOUNTER — Ambulatory Visit: Payer: Medicare PPO | Admitting: Orthopedic Surgery

## 2022-01-21 VITALS — BP 136/60 | HR 69 | Ht 70.0 in | Wt 185.0 lb

## 2022-01-21 DIAGNOSIS — Z01818 Encounter for other preprocedural examination: Secondary | ICD-10-CM

## 2022-01-21 DIAGNOSIS — Z9889 Other specified postprocedural states: Secondary | ICD-10-CM

## 2022-01-21 DIAGNOSIS — G5602 Carpal tunnel syndrome, left upper limb: Secondary | ICD-10-CM

## 2022-01-21 NOTE — Addendum Note (Signed)
Addended byCandice Camp on: 01/21/2022 10:07 AM   Modules accepted: Orders

## 2022-01-21 NOTE — Progress Notes (Signed)
Chief Complaint  Patient presents with   Follow-up    Recheck on left CTS preop.   Encounter Diagnoses  Name Primary?   S/P carpal tunnel release right 10/28/21 Yes   Carpal tunnel syndrome, left upper limb     81 year old male status post right carpal tunnel release on 10/28/2021 has 40% improvement no night pain improvement in ADLs  Patient is requesting left carpal tunnel release  He is on Plavix.  Will stop that 10 days before which is is usual way of handling his Plavix  Symptoms include pain paresthesias decreased sensation left upper extremity primarily median nerve distribution  Plan is for left carpal release  Patient would like to have surgery in January, date available January 26 postop 2 weeks later for suture removal

## 2022-01-21 NOTE — Addendum Note (Signed)
Addended byCandice Camp on: 01/21/2022 11:30 AM   Modules accepted: Orders

## 2022-02-20 DIAGNOSIS — D7589 Other specified diseases of blood and blood-forming organs: Secondary | ICD-10-CM | POA: Insufficient documentation

## 2022-02-26 ENCOUNTER — Other Ambulatory Visit (HOSPITAL_COMMUNITY): Payer: Self-pay | Admitting: Otolaryngology

## 2022-02-26 DIAGNOSIS — E041 Nontoxic single thyroid nodule: Secondary | ICD-10-CM

## 2022-03-03 ENCOUNTER — Encounter (HOSPITAL_COMMUNITY): Payer: Self-pay

## 2022-03-03 ENCOUNTER — Other Ambulatory Visit: Payer: Self-pay

## 2022-03-03 ENCOUNTER — Encounter (HOSPITAL_COMMUNITY)
Admission: RE | Admit: 2022-03-03 | Discharge: 2022-03-03 | Disposition: A | Discharge status: Home / Self Care | Payer: Medicare PPO | Source: Ambulatory Visit | Attending: Orthopedic Surgery | Admitting: Orthopedic Surgery

## 2022-03-05 NOTE — H&P (Signed)
Randy Pena is an 82 y.o. male.    Chief Complaint: left hand numbness and tingling   HPI: This is an 82 year old male who has carpal tunnel symptoms in his left upper extremity which have been documented with appropriate testing.  He is failed conservative management.  He had successful carpal tunnel release on the opposite right side.  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   Spinal stenosis    Thyroid nodule    being monitored by Dr Benjamine Mola    Past Surgical History:  Procedure Laterality Date   CARPAL TUNNEL RELEASE Right 10/28/2021   Procedure: CARPAL TUNNEL RELEASE;  Surgeon: Carole Civil, MD;  Location: [facility];  Service: Orthopedics;  Laterality: Right;   CHEST WALL BIOPSY     melanoma-[facility] [facility]   EYE SURGERY Right    Laser surgery   JOINT REPLACEMENT     total right knee replacement today   LUMBAR FUSION     TOTAL KNEE ARTHROPLASTY  03/07/2012   Procedure: TOTAL KNEE ARTHROPLASTY;  Surgeon: Carole Civil, MD;  Location: [facility];  Service: Orthopedics;  Laterality: Right;  depuy    Family History  Problem Relation Age of Onset   Heart disease Other    Arthritis Other    Cancer Other    Diabetes Other    Social History:  reports that he has never smoked. He has never used smokeless tobacco. He reports current alcohol use. He reports that he does not use drugs.  Allergies:  Allergies  Allergen Reactions   Statins     Muscle pain and stiffness, tolerates lipitor     No medications prior to admission.    No results found for this or any previous visit (from the past 48 hour(s)). No results found.  Review of Systems  Constitutional:  Negative for fever.  Musculoskeletal:  Positive for arthralgias.  Neurological:   Positive for numbness.  All other systems reviewed and are negative.  BP (!) 115/58   Pulse 60   Temp 97.9 F (36.6 C) (Oral)   Resp 18   Ht '5\' 9"'$  (1.753 m)   Wt 83.5 kg   SpO2 98%   BMI 27.18 kg/m    Physical Exam Constitutional:      General: He is not in acute distress.    Appearance: Normal appearance. He is not ill-appearing, toxic-appearing or diaphoretic.  HENT:     Head: Normocephalic and atraumatic.     Right Ear: External ear normal.     Left Ear: External ear normal.     Nose: No congestion or rhinorrhea.     Mouth/Throat:     Mouth: Mucous membranes are moist.     Pharynx: Oropharynx is clear. No oropharyngeal exudate or posterior oropharyngeal erythema.  Eyes:     General: No scleral icterus.       Right eye: No discharge.        Left eye: No discharge.     Extraocular Movements: Extraocular movements intact.     Conjunctiva/sclera: Conjunctivae normal.     Pupils: Pupils are equal, round, and reactive to light.  Cardiovascular:     Rate and Rhythm: Normal rate and regular rhythm.  Pulses: Normal pulses.  Pulmonary:     Effort: Pulmonary effort is normal.     Breath sounds: Normal breath sounds. No wheezing.  Musculoskeletal:        General: Normal range of motion.     Cervical back: Normal range of motion.     Comments: Left hand  Skin is warm dry intact no signs of lesion that would prevent surgery  Tenderness is noted over the carpal tunnel  Range of motion of the hand and wrist are normal.  No instability noted in the wrist  Motor exam shows slight weakness in grip but no atrophy in the thenar or hypothenar musculature  Provocative test for carpal tunnel include positive compression test negative Tinel's positive wrist flexion test.  Skin:    General: Skin is warm and dry.     Capillary Refill: Capillary refill takes less than 2 seconds.  Neurological:     General: No focal deficit present.     Mental Status: He is alert and oriented to  person, place, and time.     Coordination: Coordination normal.     Gait: Gait normal.     Deep Tendon Reflexes: Reflexes normal.  Psychiatric:        Mood and Affect: Mood normal.        Behavior: Behavior normal.        Thought Content: Thought content normal.        Judgment: Judgment normal.      Assessment/Plan Left carpal tunnel release open technique  The procedure has been fully reviewed with the patient; The risks and benefits of surgery have been discussed and explained and understood. Alternative treatment has also been reviewed, questions were encouraged and answered. The postoperative plan is also been reviewed.   [name], MD 03/05/2022, 4:32 PM

## 2022-03-06 ENCOUNTER — Ambulatory Visit (HOSPITAL_COMMUNITY): Payer: Medicare PPO | Admitting: Anesthesiology

## 2022-03-06 ENCOUNTER — Encounter (HOSPITAL_COMMUNITY): Payer: Self-pay | Admitting: Orthopedic Surgery

## 2022-03-06 ENCOUNTER — Ambulatory Visit (HOSPITAL_COMMUNITY)
Admission: RE | Admit: 2022-03-06 | Discharge: 2022-03-06 | Disposition: A | Discharge status: Home / Self Care | Payer: Medicare PPO | Attending: Orthopedic Surgery | Admitting: Orthopedic Surgery

## 2022-03-06 ENCOUNTER — Other Ambulatory Visit: Payer: Self-pay

## 2022-03-06 ENCOUNTER — Encounter (HOSPITAL_COMMUNITY)
Admission: RE | Disposition: A | Discharge status: Home / Self Care | Payer: Self-pay | Source: Home / Self Care | Attending: Orthopedic Surgery

## 2022-03-06 DIAGNOSIS — G5602 Carpal tunnel syndrome, left upper limb: Secondary | ICD-10-CM | POA: Diagnosis present

## 2022-03-06 DIAGNOSIS — I10 Essential (primary) hypertension: Secondary | ICD-10-CM | POA: Diagnosis not present

## 2022-03-06 HISTORY — PX: CARPAL TUNNEL RELEASE: SHX101

## 2022-03-06 SURGERY — CARPAL TUNNEL RELEASE
Anesthesia: General | Site: Hand | Laterality: Left

## 2022-03-06 MED ORDER — HYDROCODONE-ACETAMINOPHEN 5-325 MG PO TABS: 1.0000 | ORAL_TABLET | Freq: Four times a day (QID) | ORAL | 0 refills | Status: DC | PRN

## 2022-03-06 MED ORDER — BUPIVACAINE HCL (PF) 0.5 % IJ SOLN
INTRAMUSCULAR | Status: AC
  Filled 2022-03-06: qty 30

## 2022-03-06 MED ORDER — 0.9 % SODIUM CHLORIDE (POUR BTL) OPTIME
TOPICAL | Status: DC | PRN
  Administered 2022-03-06: 1000 mL

## 2022-03-06 MED ORDER — ACETAMINOPHEN 500 MG PO TABS: 500.0000 mg | ORAL_TABLET | Freq: Four times a day (QID) | ORAL | 0 refills | Status: AC | PRN

## 2022-03-06 MED ORDER — CHLORHEXIDINE GLUCONATE 0.12 % MT SOLN: 15.0000 mL | Freq: Once | OROMUCOSAL | Status: AC

## 2022-03-06 MED ORDER — FENTANYL CITRATE (PF) 100 MCG/2ML IJ SOLN
INTRAMUSCULAR | Status: AC
  Filled 2022-03-06: qty 2

## 2022-03-06 MED ORDER — ORAL CARE MOUTH RINSE: 15.0000 mL | Freq: Once | OROMUCOSAL | Status: AC

## 2022-03-06 MED ORDER — CEFAZOLIN SODIUM-DEXTROSE 2-4 GM/100ML-% IV SOLN
INTRAVENOUS | Status: AC
  Filled 2022-03-06: qty 100

## 2022-03-06 MED ORDER — BUPIVACAINE HCL (PF) 0.5 % IJ SOLN
INTRAMUSCULAR | Status: DC | PRN
  Administered 2022-03-06: 10 mL

## 2022-03-06 MED ORDER — IBUPROFEN 800 MG PO TABS: 800.0000 mg | ORAL_TABLET | Freq: Three times a day (TID) | ORAL | 0 refills | Status: AC | PRN

## 2022-03-06 MED ORDER — LIDOCAINE HCL (PF) 1 % IJ SOLN
INTRAMUSCULAR | Status: DC | PRN
  Administered 2022-03-06: 10 mL

## 2022-03-06 MED ORDER — CHLORHEXIDINE GLUCONATE 0.12 % MT SOLN
OROMUCOSAL | Status: AC
  Administered 2022-03-06: 15 mL via OROMUCOSAL
  Filled 2022-03-06: qty 15

## 2022-03-06 MED ORDER — CEFAZOLIN SODIUM-DEXTROSE 2-4 GM/100ML-% IV SOLN
2.0000 g | INTRAVENOUS | Status: AC
  Administered 2022-03-06: 2 g via INTRAVENOUS

## 2022-03-06 MED ORDER — LIDOCAINE HCL (CARDIAC) PF 100 MG/5ML IV SOSY
PREFILLED_SYRINGE | INTRAVENOUS | Status: DC | PRN
  Administered 2022-03-06: 40 mg via INTRAVENOUS

## 2022-03-06 MED ORDER — PROPOFOL 10 MG/ML IV BOLUS
INTRAVENOUS | Status: DC | PRN
  Administered 2022-03-06: 30 mg via INTRAVENOUS
  Administered 2022-03-06 (×3): 20 mg via INTRAVENOUS

## 2022-03-06 MED ORDER — LACTATED RINGERS IV SOLN: INTRAVENOUS | Status: DC

## 2022-03-06 MED ORDER — FENTANYL CITRATE (PF) 100 MCG/2ML IJ SOLN
INTRAMUSCULAR | Status: DC | PRN
  Administered 2022-03-06: 25 ug via INTRAVENOUS
  Administered 2022-03-06: 50 ug via INTRAVENOUS
  Administered 2022-03-06: 25 ug via INTRAVENOUS

## 2022-03-06 MED ORDER — LIDOCAINE HCL (PF) 1 % IJ SOLN
INTRAMUSCULAR | Status: AC
  Filled 2022-03-06: qty 30

## 2022-03-06 MED ORDER — PROPOFOL 500 MG/50ML IV EMUL
INTRAVENOUS | Status: DC | PRN
  Administered 2022-03-06: 25 ug/kg/min via INTRAVENOUS

## 2022-03-06 SURGICAL SUPPLY — 39 items
APL PRP STRL LF DISP 70% ISPRP (MISCELLANEOUS) ×1
BANDAGE ESMARK 4X12 BL STRL LF (DISPOSABLE) ×2 IMPLANT
BLADE SURG 15 STRL LF DISP TIS (BLADE) ×2 IMPLANT
BLADE SURG 15 STRL SS (BLADE) ×1
BNDG CMPR 12X4 ELC STRL LF (DISPOSABLE) ×1
BNDG CMPR STD VLCR NS LF 5.8X3 (GAUZE/BANDAGES/DRESSINGS) ×1
BNDG ELASTIC 3X5.8 VLCR NS LF (GAUZE/BANDAGES/DRESSINGS) ×2 IMPLANT
BNDG ELASTIC 3X5.8 VLCR STR LF (GAUZE/BANDAGES/DRESSINGS) IMPLANT
BNDG ESMARK 4X12 BLUE STRL LF (DISPOSABLE) ×1
BNDG GAUZE DERMACEA FLUFF 4 (GAUZE/BANDAGES/DRESSINGS) IMPLANT
BNDG GZE DERMACEA 4 6PLY (GAUZE/BANDAGES/DRESSINGS) ×1
CHLORAPREP W/TINT 26 (MISCELLANEOUS) ×2 IMPLANT
CLOTH BEACON ORANGE TIMEOUT ST (SAFETY) ×2 IMPLANT
COVER LIGHT HANDLE STERIS (MISCELLANEOUS) ×4 IMPLANT
CUFF TOURN SGL QUICK 18X4 (TOURNIQUET CUFF) ×2 IMPLANT
ELECT NDL TIP 2.8 STRL (NEEDLE) IMPLANT
ELECT NEEDLE TIP 2.8 STRL (NEEDLE) ×1 IMPLANT
ELECT REM PT RETURN 9FT ADLT (ELECTROSURGICAL) ×1
ELECTRODE REM PT RTRN 9FT ADLT (ELECTROSURGICAL) ×2 IMPLANT
GAUZE SPONGE 4X4 12PLY STRL (GAUZE/BANDAGES/DRESSINGS) ×2 IMPLANT
GAUZE XEROFORM 1X8 LF (GAUZE/BANDAGES/DRESSINGS) ×2 IMPLANT
GLOVE BIO SURGEON STRL SZ7 (GLOVE) IMPLANT
GLOVE BIOGEL PI IND STRL 7.0 (GLOVE) ×4 IMPLANT
GLOVE ECLIPSE 7.0 STRL STRAW (GLOVE) IMPLANT
GLOVE SS N UNI LF 8.5 STRL (GLOVE) ×2 IMPLANT
GLOVE SURG POLYISO LF SZ8 (GLOVE) ×2 IMPLANT
GOWN STRL REUS W/TWL LRG LVL3 (GOWN DISPOSABLE) ×2 IMPLANT
GOWN STRL REUS W/TWL XL LVL3 (GOWN DISPOSABLE) ×2 IMPLANT
KIT TURNOVER KIT A (KITS) ×2 IMPLANT
MANIFOLD NEPTUNE II (INSTRUMENTS) ×2 IMPLANT
NDL HYPO 21X1.5 SAFETY (NEEDLE) ×2 IMPLANT
NEEDLE HYPO 21X1.5 SAFETY (NEEDLE) ×1 IMPLANT
NS IRRIG 1000ML POUR BTL (IV SOLUTION) ×2 IMPLANT
PACK BASIC LIMB (CUSTOM PROCEDURE TRAY) ×2 IMPLANT
PAD ARMBOARD 7.5X6 YLW CONV (MISCELLANEOUS) ×2 IMPLANT
POSITIONER HAND ALUMI XLG (MISCELLANEOUS) ×2 IMPLANT
SET BASIN LINEN APH (SET/KITS/TRAYS/PACK) ×2 IMPLANT
SUT ETHILON 3 0 FSL (SUTURE) ×2 IMPLANT
SYR CONTROL 10ML LL (SYRINGE) ×2 IMPLANT

## 2022-03-06 NOTE — Op Note (Signed)
03/06/2022  7:59 AM  PATIENT:  Randy Pena  82 y.o. male  PRE-OPERATIVE DIAGNOSIS:  left carpal tunnel syndrome  POST-OPERATIVE DIAGNOSIS:  left carpal tunnel syndrome  PROCEDURE:  Procedure(s): CARPAL TUNNEL RELEASE (Left)  Findings moderate amount of synovitis and evidence of nerve compression by abnormal shape of the nerve vascularity seem to be intact  The surgery was done as follows.  The patient was seen in the preop area he was identified as Randy Pena and his surgical site was confirmed as left wrist and hand and was marked.  Chart update completed.  Patient taken to surgery.  Patient given IV sedation.  The left upper extremities prepped and draped sterilely Timeout was completed.  The limb was exsanguinated with a 4 inch Esmarch.  10 cc of 1% plain lidocaine was injected.  The incision was made on the radial border of the ring finger and it started at the end of the carpal tunnel and extended up to the distal wrist crease subcutaneous tissue was divided.  The palmar fascia was identified.  Blunt dissection was carried out at the distal portion of the transverse carpal ligament and a blunt instrument was passed beneath the ligament.  This was left in place and sharp dissection was used to divide the transverse carpal ligament.  Inspection of the carpal tunnel contents showed that there were no space-occupying lesions just the synovitis the compression of the nerves.  The wound was irrigated and closed with 6 interrupted 3-0 nylon sutures.  We injected 10 cc of half percent plain Marcaine  A sterile bandage was applied.  Patient taken to recovery room in stable condition  SURGEON:  Surgeon(s) and Role:    * Carole Civil, MD - Primary  PHYSICIAN ASSISTANT:   ASSISTANTS: none   ANESTHESIA:   local and IV sedation  EBL:  none   BLOOD ADMINISTERED:none  DRAINS: none   LOCAL MEDICATIONS USED:  MARCAINE    and LIDOCAINE   SPECIMEN:  No  Specimen  DISPOSITION OF SPECIMEN:  N/A  COUNTS:  YES  TOURNIQUET:   Total Tourniquet Time Documented: Upper Arm (Left) - 20 minutes Total: Upper Arm (Left) - 20 minutes   DICTATION: .Viviann Spare Dictation  PLAN OF CARE: Discharge to home after PACU  PATIENT DISPOSITION:  PACU - hemodynamically stable.   Delay start of Pharmacological VTE agent (>24hrs) due to surgical blood loss or risk of bleeding: not applicable

## 2022-03-06 NOTE — Anesthesia Postprocedure Evaluation (Signed)
Anesthesia Post Note  Patient: Randy Pena  Procedure(s) Performed: CARPAL TUNNEL RELEASE (Left: Hand)  Patient location during evaluation: Phase II Anesthesia Type: General Level of consciousness: awake and alert and oriented Pain management: pain level controlled Vital Signs Assessment: post-procedure vital signs reviewed and stable Respiratory status: spontaneous breathing, nonlabored ventilation and respiratory function stable Cardiovascular status: blood pressure returned to baseline and stable Postop Assessment: no apparent nausea or vomiting Anesthetic complications: no  No notable events documented.   Last Vitals:  Vitals:   03/06/22 0632 03/06/22 0803  BP: (!) 115/58 109/61  Pulse: 60 (!) 59  Resp: 18 16  Temp: 36.6 C (!) 36.4 C  SpO2: 98% 97%    Last Pain:  Vitals:   03/06/22 0806  TempSrc:   PainSc: 0-No pain                 Hara Milholland C Clema Skousen

## 2022-03-06 NOTE — Interval H&P Note (Signed)
History and Physical Interval Note:  03/06/2022 7:06 AM  Randy Pena  has presented today for surgery, with the diagnosis of left carpal tunnel syndrome.  The various methods of treatment have been discussed with the patient and family. After consideration of risks, benefits and other options for treatment, the patient has consented to  Procedure(s): CARPAL TUNNEL RELEASE (Left) as a surgical intervention.  The patient's history has been reviewed, patient examined, no change in status, stable for surgery.  I have reviewed the patient's chart and labs.  Questions were answered to the patient's satisfaction.     [name]

## 2022-03-06 NOTE — Anesthesia Preprocedure Evaluation (Addendum)
Anesthesia Evaluation  Patient identified by MRN, date of birth, ID band Patient awake    Reviewed: Allergy & Precautions, H&P , NPO status , Patient's Chart, lab work & pertinent test results  Airway Mallampati: II  TM Distance: >3 FB Neck ROM: Full    Dental no notable dental hx. (+) Missing, Chipped, Dental Advisory Given   Pulmonary neg pulmonary ROS   Pulmonary exam normal breath sounds clear to auscultation       Cardiovascular Exercise Tolerance: Good hypertension, Pt. on medications Normal cardiovascular exam Rhythm:Regular Rate:Normal     Neuro/Psych  Neuromuscular disease  negative psych ROS   GI/Hepatic negative GI ROS, Neg liver ROS,,,  Endo/Other  negative endocrine ROS    Renal/GU negative Renal ROS  negative genitourinary   Musculoskeletal  (+) Arthritis , Osteoarthritis,    Abdominal   Peds negative pediatric ROS (+)  Hematology negative hematology ROS (+)   Anesthesia Other Findings   Reproductive/Obstetrics negative OB ROS                             Anesthesia Physical Anesthesia Plan  ASA: 2  Anesthesia Plan: General   Post-op Pain Management: Minimal or no pain anticipated   Induction: Intravenous  PONV Risk Score and Plan: Ondansetron and Dexamethasone  Airway Management Planned: Nasal Cannula and Natural Airway  Additional Equipment:   Intra-op Plan:   Post-operative Plan:   Informed Consent: I have reviewed the patients History and Physical, chart, labs and discussed the procedure including the risks, benefits and alternatives for the proposed anesthesia with the patient or authorized representative who has indicated his/her understanding and acceptance.     Dental advisory given  Plan Discussed with: CRNA and Surgeon  Anesthesia Plan Comments: (Iv sedation with local block by Dr. Aline Brochure)        Anesthesia Quick Evaluation

## 2022-03-06 NOTE — Transfer of Care (Signed)
Immediate Anesthesia Transfer of Care Note  Patient: Randy Pena  Procedure(s) Performed: CARPAL TUNNEL RELEASE (Left: Hand)  Patient Location: Short Stay  Anesthesia Type:General  Level of Consciousness: awake, alert , and oriented  Airway & Oxygen Therapy: Patient Spontanous Breathing  Post-op Assessment: Report given to RN and Post -op Vital signs reviewed and stable  Post vital signs: Reviewed and stable  Last Vitals:  Vitals Value Taken Time  BP 109/61 03/06/22 0803  Temp 36.4 C 03/06/22 0803  Pulse 59 03/06/22 0803  Resp 16 03/06/22 0803  SpO2 97 % 03/06/22 0803    Last Pain:  Vitals:   03/06/22 0803  TempSrc: Oral  PainSc:          Complications: No notable events documented.

## 2022-03-06 NOTE — Brief Op Note (Signed)
03/06/2022  7:59 AM  PATIENT:  Randy Pena  82 y.o. male  PRE-OPERATIVE DIAGNOSIS:  left carpal tunnel syndrome  POST-OPERATIVE DIAGNOSIS:  left carpal tunnel syndrome  PROCEDURE:  Procedure(s): CARPAL TUNNEL RELEASE (Left)  Findings moderate amount of synovitis and evidence of nerve compression by abnormal shape of the nerve vascularity seem to be intact  The surgery was done as follows.  The patient was seen in the preop area he was identified as Randy Pena and his surgical site was confirmed as left wrist and hand and was marked.  Chart update completed.  Patient taken to surgery.  Patient given IV sedation.  The left upper extremities prepped and draped sterilely Timeout was completed.  The limb was exsanguinated with a 4 inch Esmarch.  10 cc of 1% plain lidocaine was injected.  The incision was made on the radial border of the ring finger and it started at the end of the carpal tunnel and extended up to the distal wrist crease subcutaneous tissue was divided.  The palmar fascia was identified.  Blunt dissection was carried out at the distal portion of the transverse carpal ligament and a blunt instrument was passed beneath the ligament.  This was left in place and sharp dissection was used to divide the transverse carpal ligament.  Inspection of the carpal tunnel contents showed that there were no space-occupying lesions just the synovitis the compression of the nerves.  The wound was irrigated and closed with 6 interrupted 3-0 nylon sutures.  We injected 10 cc of half percent plain Marcaine  A sterile bandage was applied.  Patient taken to recovery room in stable condition  SURGEON:  Surgeon(s) and Role:    * Carole Civil, MD - Primary  PHYSICIAN ASSISTANT:   ASSISTANTS: none   ANESTHESIA:   local and IV sedation  EBL:  none   BLOOD ADMINISTERED:none  DRAINS: none   LOCAL MEDICATIONS USED:  MARCAINE    and LIDOCAINE   SPECIMEN:  No  Specimen  DISPOSITION OF SPECIMEN:  N/A  COUNTS:  YES  TOURNIQUET:   Total Tourniquet Time Documented: Upper Arm (Left) - 20 minutes Total: Upper Arm (Left) - 20 minutes   DICTATION: .Viviann Spare Dictation  PLAN OF CARE: Discharge to home after PACU  PATIENT DISPOSITION:  PACU - hemodynamically stable.   Delay start of Pharmacological VTE agent (>24hrs) due to surgical blood loss or risk of bleeding: not applicable

## 2022-03-12 ENCOUNTER — Encounter (HOSPITAL_COMMUNITY): Payer: Self-pay | Admitting: Orthopedic Surgery

## 2022-03-13 ENCOUNTER — Ambulatory Visit (HOSPITAL_COMMUNITY)
Admission: RE | Admit: 2022-03-13 | Discharge: 2022-03-13 | Disposition: A | Discharge status: Home / Self Care | Payer: Medicare PPO | Source: Ambulatory Visit | Attending: Otolaryngology | Admitting: Otolaryngology

## 2022-03-13 DIAGNOSIS — E041 Nontoxic single thyroid nodule: Secondary | ICD-10-CM | POA: Insufficient documentation

## 2022-03-20 ENCOUNTER — Ambulatory Visit (INDEPENDENT_AMBULATORY_CARE_PROVIDER_SITE_OTHER): Payer: Medicare PPO | Admitting: Orthopedic Surgery

## 2022-03-20 DIAGNOSIS — Z9889 Other specified postprocedural states: Secondary | ICD-10-CM

## 2022-03-20 NOTE — Progress Notes (Signed)
Chief Complaint  Patient presents with   Follow-up    Recheck on left CTR, DOS 03-06-22.    Encounter Diagnosis  Name Primary?   S/P carpal tunnel release right 10/28/21 left 03/06/22 Yes     POD # 14   LEFT CTR   Wound check  The wound looks good we will remove the sutures and follow-up in 4 weeks the patient is encouraged to use his hand with active range of motion exercises  "I have good feeling in it"

## 2022-04-09 ENCOUNTER — Encounter: Payer: Self-pay | Admitting: Radiology

## 2022-04-17 ENCOUNTER — Encounter: Payer: Medicare PPO | Admitting: Orthopedic Surgery

## 2022-04-29 ENCOUNTER — Other Ambulatory Visit: Payer: Self-pay

## 2022-04-29 DIAGNOSIS — I779 Disorder of arteries and arterioles, unspecified: Secondary | ICD-10-CM

## 2022-04-29 DIAGNOSIS — I6523 Occlusion and stenosis of bilateral carotid arteries: Secondary | ICD-10-CM

## 2022-04-30 ENCOUNTER — Ambulatory Visit (INDEPENDENT_AMBULATORY_CARE_PROVIDER_SITE_OTHER): Payer: Medicare PPO | Admitting: Orthopedic Surgery

## 2022-04-30 ENCOUNTER — Encounter: Payer: Self-pay | Admitting: Orthopedic Surgery

## 2022-04-30 DIAGNOSIS — Z9889 Other specified postprocedural states: Secondary | ICD-10-CM

## 2022-04-30 NOTE — Progress Notes (Signed)
Encounter Diagnosis  Name Primary?   S/P carpal tunnel release right 10/28/21 Yes   Chief Complaint  Patient presents with   Post-op Follow-up    CTR left 03/06/22 hands feel okay    Ankle Pain    Has bilateral ankle pain,states PCP sent for xrays he has bilateral severe OA xrays available in [place] pulled up at [facility] I am using/ [facility]    Lemoine has complete resolution of his left carpal tunnel and 75-80  Percent resolution of his right carpal tunnel  We discussed x-rays that were done by primary care due to his complaining of ankle swelling and stiffness after sitting  After discussing this with him we decided to use topical medication twice a day as his symptoms only occur after he has been quiescent for several minutes and has to get back up  Follow-up as needed

## 2022-05-20 ENCOUNTER — Ambulatory Visit (INDEPENDENT_AMBULATORY_CARE_PROVIDER_SITE_OTHER): Payer: Medicare PPO

## 2022-05-20 ENCOUNTER — Encounter: Payer: Self-pay | Admitting: Vascular Surgery

## 2022-05-20 ENCOUNTER — Ambulatory Visit: Payer: Medicare PPO | Admitting: Vascular Surgery

## 2022-05-20 VITALS — BP 123/72 | HR 58 | Temp 97.3°F | Ht 69.0 in | Wt 191.2 lb

## 2022-05-20 DIAGNOSIS — I6523 Occlusion and stenosis of bilateral carotid arteries: Secondary | ICD-10-CM

## 2022-05-20 DIAGNOSIS — I779 Disorder of arteries and arterioles, unspecified: Secondary | ICD-10-CM

## 2022-05-20 NOTE — Progress Notes (Signed)
[facility]   Patient name: Randy Pena MRN: [id number] DOB: [birth year] Sex: male  REASON FOR VISIT: Follow-up asymptomatic carotid disease  HPI: Randy Pena is a 82 y.o. male today for follow-up.  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  He remains completely asymptomatic.  He has had carpal tunnel bilaterally since my last visit with him.  He is right-handed.  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   Spinal stenosis    Thyroid nodule    being monitored by Dr Suszanne Conners    Family History  Problem Relation Age of Onset   Heart disease Other    Arthritis Other    Cancer Other    Diabetes Other     SOCIAL HISTORY: Social History   Tobacco Use   Smoking status: Never   Smokeless tobacco: Never  Substance Use Topics   Alcohol use: Yes    Comment: social, one drink maybe every 2 weeks or so per patient    Allergies  Allergen Reactions   Statins     Muscle pain and stiffness, tolerates lipitor     Current Outpatient Medications  Medication Sig Dispense Refill   acetaminophen (TYLENOL) 500 MG tablet Take 1 tablet (500 mg total) by mouth every 6 (six) hours as needed. 30 tablet 0   atorvastatin (LIPITOR) 10 MG tablet Take 10 mg by mouth every evening.     clopidogrel (PLAVIX) 75 MG tablet Take 75 mg by mouth daily.     ibuprofen (ADVIL) 800 MG tablet Take 1 tablet (800 mg total) by mouth every 8 (eight) hours as needed. 60 tablet 0   lisinopril (ZESTRIL) 20 MG tablet Take 20 mg by mouth daily.     Multiple Vitamin (MULTIVITAMIN WITH MINERALS) TABS tablet Take 1 tablet by mouth daily.     Pseudoephedrine-Ibuprofen (ADVIL COLD/SINUS) 30-200 MG TABS Take 1-2 tablets by mouth daily as needed (sinus congestion).      No current facility-administered medications for this visit.    REVIEW OF SYSTEMS:  [X]  denotes positive finding, [ ]  denotes negative finding Cardiac  Comments:  Chest pain or chest pressure:    Shortness of breath upon exertion:    Short of breath when lying flat:    Irregular heart rhythm:        Vascular    Pain in calf, thigh, or hip brought on by ambulation:    Pain in feet at night that wakes you up from your sleep:     Blood clot in your veins:    Leg swelling:           PHYSICAL EXAM: Vitals:   05/20/22 1247 05/20/22 1249  BP: (!) 145/70 123/72  Pulse: (!) 58   Temp: (!) 97.3 F (36.3 C)   SpO2: 98%   Weight: 191 lb 3.2 oz (86.7 kg)   Height: 5\' 9"  (1.753 m)     GENERAL: The patient is a well-nourished male, in no acute distress. The vital signs are documented above. CARDIOVASCULAR: Carotid arteries without bruits bilaterally.  2+ radial pulses bilaterally. PULMONARY: There is good air exchange  MUSCULOSKELETAL: There are no major deformities or cyanosis. NEUROLOGIC: No focal weakness or paresthesias are detected. SKIN: There are no  ulcers or rashes noted. PSYCHIATRIC: The patient has a normal affect.  DATA:  Carotid duplex today reveals no evidence of stenosis in his right internal carotid artery.  He has no change in his moderate to severe left internal carotid artery stenosis.  His velocities are 229 cm/s systolic and 67 cm/s end-diastolic velocity.  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  I again reviewed symptoms of carotid disease with him and he knows to report immediately should this occur.  Otherwise we will see him again in 6 months with repeat carotid duplex  He is on Plavix.  I reviewed his chart and do not see any specific indication.  He does not remember exactly why he was placed on this but is convinced that it is not for cardiac issues.  I feel that he is safe to discontinue his Plavix and be  treated with aspirin 81 mg daily for antiplatelet effect.    Larina Earthly, MD FACS [facility] [phone]  Note: Portions of this report may have been transcribed using voice recognition software.  Every effort has been made to ensure accuracy; however, inadvertent computerized transcription errors may still be present.

## 2022-08-03 ENCOUNTER — Telehealth: Payer: Self-pay

## 2022-08-03 NOTE — Telephone Encounter (Signed)
Returned call to patient's voicemail message and asked him to call the office in reference to scheduling an appointment.

## 2022-08-10 ENCOUNTER — Ambulatory Visit: Payer: Medicare PPO | Admitting: Orthopedic Surgery

## 2022-08-10 ENCOUNTER — Other Ambulatory Visit (INDEPENDENT_AMBULATORY_CARE_PROVIDER_SITE_OTHER): Payer: Medicare PPO

## 2022-08-10 VITALS — BP 124/70 | HR 93 | Ht 69.0 in | Wt 188.0 lb

## 2022-08-10 DIAGNOSIS — M75112 Incomplete rotator cuff tear or rupture of left shoulder, not specified as traumatic: Secondary | ICD-10-CM | POA: Diagnosis not present

## 2022-08-10 DIAGNOSIS — M25512 Pain in left shoulder: Secondary | ICD-10-CM

## 2022-08-10 DIAGNOSIS — G8929 Other chronic pain: Secondary | ICD-10-CM

## 2022-08-10 MED ORDER — METHYLPREDNISOLONE ACETATE 40 MG/ML IJ SUSP
40.0000 mg | Freq: Once | INTRAMUSCULAR | Status: AC
  Administered 2022-08-10: 40 mg via INTRA_ARTICULAR

## 2022-08-10 NOTE — Progress Notes (Signed)
Chief Complaint  Patient presents with   Shoulder Pain    Left- bothering me for 2-3 months then fell 2 weeks ago and hyperextended it, worse at night, can't use it   82 year old male with left shoulder pain since January.  When he was moving some things around for the Christmas holiday aftermath he started having some discomfort in his shoulder then he fell and reached out to break his fall and then the pain increased in the shoulder.  This is associated with night symptoms weakness with overhead activity  Exam reveals tenderness in the rotator interval weakness in abduction forward elevation and abduction in the scapular plane range of motion is normal  X-rays show slight proximal migration of the humeral head but no glenohumeral arthritis  Impression  Encounter Diagnoses  Name Primary?   Chronic left shoulder pain    Nontraumatic incomplete tear of left rotator cuff Yes    Recommend home exercise program subacromial injection recheck in 8 weeks  Procedure  Procedure note the subacromial injection shoulder left   Verbal consent was obtained to inject the  Left   Shoulder  Timeout was completed to confirm the injection site is a subacromial space of the  left  shoulder  Medication used Depo-Medrol 40 mg and lidocaine 1% 3 cc  Anesthesia was provided by ethyl chloride  The injection was performed in the left  posterior subacromial space. After pinning the skin with alcohol and anesthetized the skin with ethyl chloride the subacromial space was injected using a 20-gauge needle. There were no complications  Sterile dressing was applied.

## 2022-10-05 ENCOUNTER — Ambulatory Visit: Payer: Medicare PPO | Admitting: Orthopedic Surgery

## 2022-10-05 ENCOUNTER — Encounter: Payer: Self-pay | Admitting: Orthopedic Surgery

## 2022-10-05 VITALS — BP 129/66 | HR 67 | Ht 69.0 in | Wt 188.0 lb

## 2022-10-05 DIAGNOSIS — G8929 Other chronic pain: Secondary | ICD-10-CM

## 2022-10-05 DIAGNOSIS — M75112 Incomplete rotator cuff tear or rupture of left shoulder, not specified as traumatic: Secondary | ICD-10-CM

## 2022-10-05 NOTE — Progress Notes (Signed)
   VISIT TYPE: FOLLOW UP   Chief Complaint  Patient presents with  . Shoulder Pain    Left / feeling better     Encounter Diagnoses  Name Primary?  . Chronic left shoulder pain Yes  . Nontraumatic incomplete tear of left rotator cuff     Assessment and Plan: Randy Pena had an injection in his left shoulder and has regained his range of motion and is able to sleep at night.  No further treatment at this time  Recommend strengthening exercises   Prior treatment:  Subacromial injection left shoulder   HPI:   Atraumatic left shoulder pain.  Improved after injection   82 year old male with left shoulder pain since January.  When he was moving some things around for the Christmas holiday aftermath he started having some discomfort in his shoulder then he fell and reached out to break his fall and then the pain increased in the shoulder.  This is associated with night symptoms weakness with overhead activity   Exam reveals tenderness in the rotator interval weakness in abduction forward elevation and abduction in the scapular plane range of motion is normal   X-rays show slight proximal migration of the humeral head but no glenohumeral arthritis      BP 129/66   Pulse 67   Ht 5\' 9"  (1.753 m)   Wt 188 lb (85.3 kg)   BMI 27.76 kg/m   Left Shoulder Exam   Range of Motion  Active abduction:  normal  Extension:  normal  External rotation:  normal  Forward flexion:  160       Imaging none  A/P Encounter Diagnoses  Name Primary?  . Chronic left shoulder pain Yes  . Nontraumatic incomplete tear of left rotator cuff     No orders of the defined types were placed in this encounter.

## 2022-10-05 NOTE — Progress Notes (Signed)
shou

## 2023-01-27 ENCOUNTER — Encounter: Payer: Self-pay | Admitting: Orthopedic Surgery

## 2023-01-27 ENCOUNTER — Other Ambulatory Visit (INDEPENDENT_AMBULATORY_CARE_PROVIDER_SITE_OTHER): Payer: Medicare PPO

## 2023-01-27 ENCOUNTER — Ambulatory Visit: Payer: Medicare PPO | Admitting: Orthopedic Surgery

## 2023-01-27 VITALS — BP 145/75 | HR 66 | Ht 69.0 in | Wt 193.0 lb

## 2023-01-27 DIAGNOSIS — G8929 Other chronic pain: Secondary | ICD-10-CM | POA: Diagnosis not present

## 2023-01-27 DIAGNOSIS — M7061 Trochanteric bursitis, right hip: Secondary | ICD-10-CM | POA: Diagnosis not present

## 2023-01-27 DIAGNOSIS — M545 Low back pain, unspecified: Secondary | ICD-10-CM

## 2023-01-27 DIAGNOSIS — M25551 Pain in right hip: Secondary | ICD-10-CM

## 2023-01-27 NOTE — Patient Instructions (Signed)
Consider medications as needed  Multiple treatment such as creams, sprays and patches can be effective.  They are interested in an injection in the lateral hip, please contact the clinic.

## 2023-01-27 NOTE — Progress Notes (Signed)
New Patient Visit  Assessment: Randy Pena is a 82 y.o. male with the following: Chronic low back pain Right-sided greater trochanteric bursitis  Plan: Randy Pena has pain in the right lateral hip primarily, but this is improved.  He does have tenderness over the greater trochanter on the right.  Mild tenderness to palpation within the buttock on the left.  He has a multilevel lumbar spine fusion, with some degenerative changes proximal and distal to the construct.  This could be contributing to some of the symptoms into his lower back and buttocks, and is responsible for some of the radiating symptoms into the lower leg.  He is aware that he needs to follow-up with his surgeons office.  We discussed proceeding with a greater trochanteric bursa injection on the right.  He is improving, so we will defer.  Otherwise, continue with medications, and consider topical treatments.  If the pain persists, or worsens, would recommend an injection.  Follow-up: Return if symptoms worsen or fail to improve.  Subjective:  Chief Complaint  Patient presents with   Hip Pain    R side hip pain with numbness going to the foot. Pt has had back surgery '18 and epidurals.    History of Present Illness: Randy Pena is a 82 y.o. male who presents for evaluation of right hip pain.  He states has had pain in the right buttock, and the right lateral hip for the past 1-2 weeks.  At one point, it was severe.  The pain in the right has improved, but he is starting develop similar type pains in the left.  He does have a history of lumbar spinal fusion, which was completed several years ago.  He has not followed up with his surgeon in a couple of years.  He states his surgeon has retired within the last year.  He remains active.  Prior to the onset of pain, he states he was using a backpack blower for several hours over couple of days.  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  He takes Aleve  occasionally.  No topical treatments.  He has had injections in his back, but none in his lateral hip or groin.  He denies groin pain.   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   Spinal stenosis    Thyroid nodule    being monitored by Dr Suszanne Conners    Past Surgical History:  Procedure Laterality Date   CARPAL TUNNEL RELEASE Right 10/28/2021   Procedure: CARPAL TUNNEL RELEASE;  Surgeon: Vickki Hearing, MD;  Location: [facility];  Service: Orthopedics;  Laterality: Right;   CARPAL TUNNEL RELEASE Left 03/06/2022   Procedure: CARPAL TUNNEL RELEASE;  Surgeon: Vickki Hearing, MD;  Location: [facility];  Service: Orthopedics;  Laterality: Left;   CHEST WALL BIOPSY     melanoma-[facility]   EYE SURGERY Right    Laser surgery   JOINT REPLACEMENT     total right knee replacement today   LUMBAR FUSION     TOTAL KNEE ARTHROPLASTY  03/07/2012  Procedure: TOTAL KNEE ARTHROPLASTY;  Surgeon: Vickki Hearing, MD;  Location: [facility];  Service: Orthopedics;  Laterality: Right;  depuy    Family History  Problem Relation Age of Onset   Heart disease Other    Arthritis Other    Cancer Other    Diabetes Other    Social History   Tobacco Use   Smoking status: Never   Smokeless tobacco: Never  Vaping Use   Vaping status: Never Used  Substance Use Topics   Alcohol use: Yes    Comment: social, one drink maybe every 2 weeks or so per patient   Drug use: No    Allergies  Allergen Reactions   Statins     Muscle pain and stiffness, tolerates lipitor     Current Meds  Medication Sig   acetaminophen (TYLENOL) 500 MG tablet Take 1  tablet (500 mg total) by mouth every 6 (six) hours as needed.   aspirin 81 MG chewable tablet Chew by mouth daily.   atorvastatin (LIPITOR) 10 MG tablet Take 10 mg by mouth every evening.   ibuprofen (ADVIL) 800 MG tablet Take 1 tablet (800 mg total) by mouth every 8 (eight) hours as needed.   lisinopril (ZESTRIL) 20 MG tablet Take 20 mg by mouth daily.   Multiple Vitamin (MULTIVITAMIN WITH MINERALS) TABS tablet Take 1 tablet by mouth daily.   Pseudoephedrine-Ibuprofen (ADVIL COLD/SINUS) 30-200 MG TABS Take 1-2 tablets by mouth daily as needed (sinus congestion).    Objective: BP (!) 145/75   Pulse 66   Ht 5\' 9"  (1.753 m)   Wt 193 lb (87.5 kg)   BMI 28.50 kg/m   Physical Exam:  General: Elderly male. and No acute distress.  Alert and oriented. Gait: Right sided antalgic gait.  Low back with well-healed surgical incision.  Tenderness to palpation within the left buttock.  Tenderness to palpation over the greater trochanter on the right.  No pain with internal or external rotation of both hips.  He has good bilateral lower extremity strength.  Unable to dorsiflex the right great toe.  Sensation intact to the dorsum of the foot.  2+ patellar tendon reflexes bilaterally.  IMAGING: I personally ordered and reviewed the following images   AP pelvis was obtained in clinic today.  No acute injuries are noted.  Well-maintained joint space.  There are some associated osteophytes on the lateral rim of the acetabulum on the right, with some calcification along the posterior aspect of the acetabulum.  There is some enthesophytes bilaterally over the greater trochanter.  No AVN.  No bony lesions.  Impression: AP pelvis with mild degenerative changes bilaterally.   Lumbar spine x-rays were obtained in clinic today.  No acute injuries are noted.  Hardware is intact.  Multilevel fusion, with cage at L4-5.  There are degenerative changes at L5-S1, with anterior based osteophytes.  Above the construct,  there are large anterior based osteophytes.  Impression: Lumbar spine x-rays without anterolisthesis.  Chronic degenerative changes.  Normal-appearing lumbar fusion without hardware failure   New Medications:  No orders of the defined types were placed in this encounter.     Oliver Barre, MD  01/27/2023 10:20 AM

## 2023-02-24 ENCOUNTER — Telehealth (INDEPENDENT_AMBULATORY_CARE_PROVIDER_SITE_OTHER): Payer: Self-pay | Admitting: Otolaryngology

## 2023-02-24 NOTE — Telephone Encounter (Signed)
 Confirmed appt & location [id number]

## 2023-02-25 ENCOUNTER — Encounter (INDEPENDENT_AMBULATORY_CARE_PROVIDER_SITE_OTHER): Payer: Self-pay

## 2023-02-25 ENCOUNTER — Ambulatory Visit (INDEPENDENT_AMBULATORY_CARE_PROVIDER_SITE_OTHER): Payer: Medicare PPO | Admitting: Otolaryngology

## 2023-02-25 VITALS — BP 133/77 | HR 66 | Ht 69.0 in | Wt 184.0 lb

## 2023-02-25 DIAGNOSIS — D440 Neoplasm of uncertain behavior of thyroid gland: Secondary | ICD-10-CM | POA: Insufficient documentation

## 2023-02-25 NOTE — Progress Notes (Signed)
Patient ID: Randy Pena, male   DOB: [birth year], 83 y.o.   MRN: [id number]  Follow up: Thyroid nodule  HPI: The patient is an 83 year old male who returns today for his follow-up evaluation. The patient was previously noted to have a 5.4 cm right thyroid nodule. The patient was mostly asymptomatic at that time. He subsequently underwent a fine needle aspiration biopsy of his right thyroid nodule. The pathology was consistent with benign nodule.  His repeat ultrasound in February 2024 showed no changes in the thyroid nodule.  The patient returns today reporting no new difficulty. He is tolerating oral intake well. He denies any dysphagia, odynophagia, or dyspnea. No other ENT, GI, or respiratory issue noted since the last visit.   Exam: General: Communicates without difficulty, well nourished, no acute distress. Head: Normocephalic, no evidence injury, no tenderness, facial buttresses intact without stepoff. Eyes: PERRL, EOMI. No scleral icterus, conjunctivae clear. Neuro: CN II exam reveals vision grossly intact. No nystagmus at any point of gaze. Ears: Auricles well formed without lesions. Ear canals are intact without mass or lesion. No erythema or edema is appreciated. The TMs are intact without fluid. Nose: External evaluation reveals normal support and skin without lesions. Dorsum is intact. Anterior rhinoscopy reveals healthy pink mucosa over anterior aspect of inferior turbinates and intact septum. No purulence noted. Oral:  Oral cavity and oropharynx are intact, symmetric, without erythema or edema. Mucosa is moist without lesions. Neck: Full range of motion without pain. There is no significant lymphadenopathy. No masses palpable. Thyroid bed with slight fullness on the right. Parotid glands and submandibular glands equal bilaterally without mass. Trachea is midline.   Assessment  1. The patient's 5.4 cm right thyroid nodule was benign on his previous FNA biopsy.  2. The patient is currently  asymptomatic. No significant compressive symptoms are noted.  3. The rest of his ENT exam is normal.   Plan  1. The physical exam findings are reviewed with the patient.  2. Continue with conservative observation. 3. The patient will return for re-evaluation in 1 year, sooner if needed.

## 2023-05-24 ENCOUNTER — Other Ambulatory Visit: Payer: Self-pay

## 2023-05-24 DIAGNOSIS — I6523 Occlusion and stenosis of bilateral carotid arteries: Secondary | ICD-10-CM

## 2023-06-15 ENCOUNTER — Encounter: Payer: Self-pay | Admitting: Vascular Surgery

## 2023-06-15 ENCOUNTER — Ambulatory Visit: Admitting: Vascular Surgery

## 2023-06-15 ENCOUNTER — Encounter

## 2023-06-15 ENCOUNTER — Ambulatory Visit

## 2023-06-15 VITALS — BP 131/71 | HR 63 | Temp 97.9°F | Resp 18 | Ht 69.0 in | Wt 189.0 lb

## 2023-06-15 DIAGNOSIS — I6523 Occlusion and stenosis of bilateral carotid arteries: Secondary | ICD-10-CM

## 2023-06-15 NOTE — Progress Notes (Signed)
 Patient name: Randy Pena MRN: [id number] DOB: March 31, 1940 Sex: male  REASON FOR CONSULT: 57-month follow-up, carotid surveillance  HPI: Randy Pena is a 83 y.o. male, with history of hypertension and carotid artery disease that presents for 70-month follow-up of his carotid disease.  Previously followed by Dr. Shirley Douglas and last seen on 05/20/2022.  Patient had moderate 60 to 79% left ICA stenosis. Denies any history of stroke or TIA on evaluation today.  No prior neck surgery.  No new concerns today.  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   Spinal stenosis    Thyroid  nodule    being monitored by Dr Darlin Ehrlich    Past Surgical History:  Procedure Laterality Date   CARPAL TUNNEL RELEASE Right 10/28/2021   Procedure: CARPAL TUNNEL RELEASE;  Surgeon: Darrin Emerald, MD;  Location: [facility];  Service: Orthopedics;  Laterality: Right;   CARPAL TUNNEL RELEASE Left 03/06/2022   Procedure: CARPAL TUNNEL RELEASE;  Surgeon: Darrin Emerald, MD;  Location: [facility];  Service: Orthopedics;  Laterality: Left;   CHEST WALL BIOPSY     melanoma-[facility]   EYE SURGERY Right    Laser surgery   JOINT REPLACEMENT     total right knee replacement today   LUMBAR FUSION     TOTAL KNEE ARTHROPLASTY  03/07/2012   Procedure: TOTAL KNEE ARTHROPLASTY;  Surgeon: Darrin Emerald, MD;  Location: [facility];  Service: Orthopedics;  Laterality: Right;  depuy    Family History  Problem Relation Age of Onset   Heart disease Other    Arthritis Other    Cancer Other    Diabetes Other     SOCIAL HISTORY: Social History   Socioeconomic History   Marital status: Married    Spouse name: Not on file   Number of children: Not on file   Years of education: BS/masters   Highest education level: Not on file  Occupational  History   Occupation: farmer    Employer: RETIRED  Tobacco Use   Smoking status: Never   Smokeless tobacco: Never  Vaping Use   Vaping status: Never Used  Substance and Sexual Activity   Alcohol use: Yes    Comment: social, one drink maybe every 2 weeks or so per patient   Drug use: No   Sexual activity: Yes    Birth control/protection: None  Other Topics Concern   Not on file  Social History Narrative   Not on file   Social Drivers of Health   Financial Resource Strain: Low Risk  (08/04/2022)   Received from [facility], [facility]   Overall Financial Resource Strain (CARDIA)    Difficulty of Paying Living Expenses: Not hard at all  Food Insecurity: No Food Insecurity (03/11/2023)   Received from [facility]   Hunger Vital Sign    Worried About Running Out of Food in the Last Year: Never true    Ran Out of Food in the Last Year: Never true  Transportation Needs: No Transportation Needs (03/11/2023)   Received from [facility] - Transportation    Lack of Transportation (Medical): No    Lack of Transportation (Non-Medical): No  Physical Activity: Sufficiently Active (08/04/2022)   Received from [facility], [facility]   Exercise Vital Sign    Days of Exercise per Week: 4 days    Minutes of Exercise per Session: 40 min  Stress: No Stress Concern Present (03/11/2023)   Received from [facility] - Occupational Stress Questionnaire    Feeling of Stress : Not at all  Social Connections: Moderately Integrated (03/11/2023)   Received from [facility]   Social Connection and Isolation Panel [NHANES]    Frequency of Communication with Friends and Family: Three times a week    Frequency of Social Gatherings with Friends and Family: Three times a week    Attends Religious Services: More than 4 times per year    Active Member of Clubs or Organizations: No    Attends Banker Meetings:  Never    Marital Status: Married  Catering manager Violence: Not At Risk (08/04/2022)   Received from [facility], [facility]   Humiliation, Afraid, Rape, and Kick questionnaire    Fear of Current or Ex-Partner: No    Emotionally Abused: No    Physically Abused: No    Sexually Abused: No    Allergies  Allergen Reactions   Statins     Muscle pain and stiffness, tolerates lipitor     Current Outpatient Medications  Medication Sig Dispense Refill   acetaminophen  (TYLENOL ) 500 MG tablet Take 1 tablet (500 mg total) by mouth every 6 (six) hours as needed. (Patient not taking: Reported on 02/25/2023) 30 tablet 0   aspirin  81 MG chewable tablet Chew by mouth daily. (Patient not taking: Reported on 02/25/2023)     atorvastatin (LIPITOR) 10 MG tablet Take 10 mg by mouth every evening.     ibuprofen  (ADVIL ) 800 MG tablet Take 1 tablet (800 mg total) by mouth every 8 (eight) hours as needed. (Patient not taking: Reported on 02/25/2023) 60 tablet 0   lisinopril  (ZESTRIL ) 20 MG tablet Take 20 mg by mouth daily.     Multiple Vitamin (MULTIVITAMIN WITH MINERALS) TABS tablet Take 1 tablet by mouth daily.     Pseudoephedrine-Ibuprofen  (ADVIL  COLD/SINUS) 30-200 MG TABS Take 1-2 tablets by mouth daily as needed (sinus congestion). (Patient not taking: Reported on 02/25/2023)     No current facility-administered medications for this visit.    REVIEW OF SYSTEMS:  [X]  denotes positive finding, [ ]  denotes negative finding Cardiac  Comments:  Chest pain or chest pressure:    Shortness of breath upon exertion:    Short of breath when lying flat:    Irregular heart rhythm:        Vascular    Pain in calf, thigh, or hip brought on by ambulation:    Pain in feet at night that wakes you up from your sleep:     Blood clot in your veins:    Leg swelling:         Pulmonary    Oxygen at home:    Productive cough:     Wheezing:         Neurologic    Sudden weakness in arms or legs:     Sudden  numbness in arms or legs:     Sudden onset of difficulty speaking or slurred speech:    Temporary loss of vision in one eye:     Problems with dizziness:         Gastrointestinal    Blood in stool:  Vomited blood:         Genitourinary    Burning when urinating:     Blood in urine:        Psychiatric    Major depression:         Hematologic    Bleeding problems:    Problems with blood clotting too easily:        Skin    Rashes or ulcers:        Constitutional    Fever or chills:      PHYSICAL EXAM: There were no vitals filed for this visit.  GENERAL: The patient is a well-nourished male, in no acute distress. The vital signs are documented above. CARDIAC: There is a regular rate and rhythm.  VASCULAR:  No prior neck incisions PULMONARY: No respiratory distress. ABDOMEN: Soft and non-tender. MUSCULOSKELETAL: There are no major deformities or cyanosis. NEUROLOGIC: No focal weakness or paresthesias are detected.  Cranial nerves II through XII grossly intact. SKIN: There are no ulcers or rashes noted. PSYCHIATRIC: The patient has a normal affect.  DATA:   Carotid duplex with 1 to 39% stenosis in the right ICA and 60 to 79% left ICA stenosis  Assessment/Plan:   83 y.o. male, with history of hypertension and carotid artery disease that presents for 38-month follow-up of his carotid disease.  Previously followed by Dr. Shirley Douglas and last seen on 05/20/2022.  Patient had moderate 60 to 79% left ICA stenosis.  Discussed his duplex today shows a stable moderate 60 to 79% left ICA stenosis.  Given asymptomatic carotid disease would recommend continued surveillance.  I discussed for asymptomatic disease recommend surgery for greater than 80% stenosis.  No indication for surgery at this time.  Have asked that he start an aspirin  as well as his statin therapy.  I will see him in 6 months with carotid duplex for ongoing surveillance.   Young Hensen, MD [facility] Office: [phone]

## 2023-10-01 ENCOUNTER — Encounter: Payer: Self-pay | Admitting: Radiology

## 2023-12-13 ENCOUNTER — Encounter: Payer: Self-pay | Admitting: Radiology
